# Patient Record
Sex: Male | Born: 1945 | Race: White | Hispanic: No | Marital: Married | Smoking: Never smoker
Health system: Southern US, Community
[De-identification: ages and names within clinical notes are randomized; demographics above are authoritative.]

## PROBLEM LIST (undated history)

## (undated) DIAGNOSIS — N400 Enlarged prostate without lower urinary tract symptoms: Secondary | ICD-10-CM

## (undated) DIAGNOSIS — G454 Transient global amnesia: Secondary | ICD-10-CM

## (undated) DIAGNOSIS — C61 Malignant neoplasm of prostate: Secondary | ICD-10-CM

## (undated) DIAGNOSIS — N289 Disorder of kidney and ureter, unspecified: Secondary | ICD-10-CM

## (undated) HISTORY — DX: Transient global amnesia: G45.4

## (undated) HISTORY — PX: FRACTURE SURGERY: SHX138

## (undated) HISTORY — PX: KIDNEY STONE SURGERY: SHX686

## (undated) HISTORY — PX: CHOLECYSTECTOMY: SHX55

---

## 2005-01-09 ENCOUNTER — Ambulatory Visit: Payer: Self-pay

## 2006-06-11 ENCOUNTER — Ambulatory Visit: Payer: Self-pay

## 2006-07-02 ENCOUNTER — Encounter: Admission: RE | Admit: 2006-07-02 | Discharge: 2006-07-02 | Payer: Self-pay | Admitting: Internal Medicine

## 2006-08-01 ENCOUNTER — Ambulatory Visit (HOSPITAL_COMMUNITY): Admission: RE | Admit: 2006-08-01 | Discharge: 2006-08-01 | Payer: Self-pay | Admitting: General Surgery

## 2011-08-27 ENCOUNTER — Encounter (INDEPENDENT_AMBULATORY_CARE_PROVIDER_SITE_OTHER): Admitting: Internal Medicine

## 2011-08-27 DIAGNOSIS — E538 Deficiency of other specified B group vitamins: Secondary | ICD-10-CM

## 2011-08-27 DIAGNOSIS — Z0000 Encounter for general adult medical examination without abnormal findings: Secondary | ICD-10-CM

## 2011-08-31 ENCOUNTER — Telehealth: Payer: Self-pay

## 2011-08-31 NOTE — Telephone Encounter (Signed)
.  [facility] PT STATES HE RECEIVED A CALL REGARDING HIS LABS PLEASE CALL [phone]

## 2011-09-01 NOTE — Telephone Encounter (Signed)
Left message on machine to call back  

## 2011-09-03 NOTE — Telephone Encounter (Signed)
Left message on machine to call back  

## 2011-09-04 NOTE — Telephone Encounter (Signed)
Sending back to Lab because do not have lab report or notes to call pt.

## 2011-09-18 ENCOUNTER — Telehealth: Payer: Self-pay

## 2011-09-18 DIAGNOSIS — R972 Elevated prostate specific antigen [PSA]: Secondary | ICD-10-CM

## 2011-09-18 NOTE — Telephone Encounter (Signed)
Pt is wanting to go ahead with a referral to [facility] Please send order to referrals workqueue  Please call [phone] or [phone]

## 2011-09-18 NOTE — Telephone Encounter (Signed)
Dr. Perrin Maltese. Chart in your box. Please refer to Urology for pt (see labs in paper chart)

## 2011-09-27 ENCOUNTER — Telehealth: Payer: Self-pay

## 2011-09-27 NOTE — Telephone Encounter (Signed)
Last office visit and labs faxed to Dawn's attention.

## 2011-09-27 NOTE — Telephone Encounter (Signed)
.  [facility] DAWN FROM DR PUSCHINSKY'S OFFICE STATES PT IS THERE NOW AND ARE IN NEED OF RECORDS PLEASE FAX TO [phone] AND THE PHONE NUMBER IS [phone]    PT WAITING

## 2012-07-13 ENCOUNTER — Encounter (HOSPITAL_COMMUNITY): Payer: Self-pay | Admitting: Nurse Practitioner

## 2012-07-13 ENCOUNTER — Emergency Department (HOSPITAL_COMMUNITY)
Admission: EM | Admit: 2012-07-13 | Discharge: 2012-07-13 | Disposition: A | Discharge status: Home / Self Care | Attending: Emergency Medicine | Admitting: Emergency Medicine

## 2012-07-13 ENCOUNTER — Emergency Department (HOSPITAL_COMMUNITY)

## 2012-07-13 ENCOUNTER — Other Ambulatory Visit: Payer: Self-pay

## 2012-07-13 ENCOUNTER — Emergency Department (INDEPENDENT_AMBULATORY_CARE_PROVIDER_SITE_OTHER)
Admission: EM | Admit: 2012-07-13 | Discharge: 2012-07-13 | Disposition: A | Discharge status: Skilled Nursing Facility | Source: Home / Self Care | Attending: Family Medicine | Admitting: Family Medicine

## 2012-07-13 ENCOUNTER — Encounter (HOSPITAL_COMMUNITY): Payer: Self-pay | Admitting: *Deleted

## 2012-07-13 DIAGNOSIS — R197 Diarrhea, unspecified: Secondary | ICD-10-CM

## 2012-07-13 DIAGNOSIS — R9431 Abnormal electrocardiogram [ECG] [EKG]: Secondary | ICD-10-CM | POA: Insufficient documentation

## 2012-07-13 DIAGNOSIS — Z7982 Long term (current) use of aspirin: Secondary | ICD-10-CM | POA: Insufficient documentation

## 2012-07-13 DIAGNOSIS — K5289 Other specified noninfective gastroenteritis and colitis: Secondary | ICD-10-CM | POA: Insufficient documentation

## 2012-07-13 DIAGNOSIS — E860 Dehydration: Secondary | ICD-10-CM

## 2012-07-13 DIAGNOSIS — K529 Noninfective gastroenteritis and colitis, unspecified: Secondary | ICD-10-CM

## 2012-07-13 DIAGNOSIS — R110 Nausea: Secondary | ICD-10-CM | POA: Insufficient documentation

## 2012-07-13 DIAGNOSIS — R5383 Other fatigue: Secondary | ICD-10-CM

## 2012-07-13 DIAGNOSIS — R531 Weakness: Secondary | ICD-10-CM

## 2012-07-13 DIAGNOSIS — IMO0001 Reserved for inherently not codable concepts without codable children: Secondary | ICD-10-CM | POA: Insufficient documentation

## 2012-07-13 DIAGNOSIS — Z79899 Other long term (current) drug therapy: Secondary | ICD-10-CM | POA: Insufficient documentation

## 2012-07-13 LAB — COMPREHENSIVE METABOLIC PANEL
BUN: 26 mg/dL — ABNORMAL HIGH (ref 6–23)
CO2: 29 mEq/L (ref 19–32)
Calcium: 9.2 mg/dL (ref 8.4–10.5)
Creatinine, Ser: 1 mg/dL (ref 0.50–1.35)
GFR calc Af Amer: 90 mL/min — ABNORMAL LOW (ref >90)
GFR calc non Af Amer: 78 mL/min — ABNORMAL LOW (ref >90)
Glucose, Bld: 106 mg/dL — ABNORMAL HIGH (ref 70–99)

## 2012-07-13 LAB — URINALYSIS, ROUTINE W REFLEX MICROSCOPIC
Leukocytes, UA: NEGATIVE
Nitrite: NEGATIVE
Specific Gravity, Urine: 1.035 — ABNORMAL HIGH (ref 1.005–1.030)
Urobilinogen, UA: 0.2 mg/dL (ref 0.0–1.0)

## 2012-07-13 LAB — CBC
MCV: 93.6 fL (ref 78.0–100.0)
Platelets: 110 10*3/uL — ABNORMAL LOW (ref 150–400)
RBC: 5.14 MIL/uL (ref 4.22–5.81)
RDW: 13.5 % (ref 11.5–15.5)
WBC: 4.6 10*3/uL (ref 4.0–10.5)

## 2012-07-13 LAB — POCT I-STAT, CHEM 8
Calcium, Ion: 1.12 mmol/L — ABNORMAL LOW (ref 1.13–1.30)
Chloride: 100 mEq/L (ref 96–112)
HCT: 50 % (ref 39.0–52.0)
Sodium: 140 mEq/L (ref 135–145)

## 2012-07-13 LAB — CBC WITH DIFFERENTIAL/PLATELET
Blasts: 0 %
Eosinophils Absolute: 0 10*3/uL (ref 0.0–0.7)
Eosinophils Relative: 0 % (ref 0–5)
HCT: 47.6 % (ref 39.0–52.0)
Lymphocytes Relative: 16 % (ref 12–46)
Lymphs Abs: 0.8 10*3/uL (ref 0.7–4.0)
Monocytes Absolute: 0.3 10*3/uL (ref 0.1–1.0)
Monocytes Relative: 6 % (ref 3–12)
Platelets: 118 10*3/uL — ABNORMAL LOW (ref 150–400)
RBC: 5.08 MIL/uL (ref 4.22–5.81)
RDW: 13.5 % (ref 11.5–15.5)
WBC: 5.1 10*3/uL (ref 4.0–10.5)
nRBC: 0 /100 WBC

## 2012-07-13 LAB — URINE MICROSCOPIC-ADD ON

## 2012-07-13 MED ORDER — KETOROLAC TROMETHAMINE 30 MG/ML IJ SOLN
15.0000 mg | Freq: Once | INTRAMUSCULAR | Status: DC
  Filled 2012-07-13: qty 1

## 2012-07-13 MED ORDER — SODIUM CHLORIDE 0.9 % IV SOLN
INTRAVENOUS | Status: DC
  Administered 2012-07-13: 17:00:00 via INTRAVENOUS

## 2012-07-13 MED ORDER — SODIUM CHLORIDE 0.9 % IV BOLUS (SEPSIS)
500.0000 mL | Freq: Once | INTRAVENOUS | Status: AC
  Administered 2012-07-13: 500 mL via INTRAVENOUS

## 2012-07-13 MED ORDER — SODIUM CHLORIDE 0.9 % IV BOLUS (SEPSIS)
1000.0000 mL | Freq: Once | INTRAVENOUS | Status: AC
  Administered 2012-07-13: 1000 mL via INTRAVENOUS

## 2012-07-13 NOTE — ED Provider Notes (Signed)
History     CSN: [id number]  Arrival date & time 07/13/12  1121   First MD Initiated Contact with Patient 07/13/12 1137      Chief Complaint  Patient presents with  . URI    (Consider location/radiation/quality/duration/timing/severity/associated sxs/prior treatment) HPI Comments: 66 year old nonsmoker and denies significant past medical history. Here complaining of general malaise, lack of appetite, nonproductive cough, liquid diarrhea about 3 times a day with no blood or mucus and generalized weakness for one week. Denies fever, congestion, vomiting, dizziness, chest pain or shortness of breath. Reports chest discomfort with talking or taking a deep breath in the last few days but actually reports, he is not feeling chest pain today. Has been drinking water for oral hydration. Not taking any other medications, denies abdominal pain or cramping.   History reviewed. No pertinent past medical history.  History reviewed. No pertinent past surgical history.  No family history on file.  History  Substance Use Topics  . Smoking status: Never Smoker   . Smokeless tobacco: Not on file  . Alcohol Use: No      Review of Systems  Constitutional: Positive for appetite change and fatigue. Negative for fever, chills and diaphoresis.  HENT: Negative for congestion and rhinorrhea.   Respiratory: Positive for cough. Negative for shortness of breath and wheezing.   Cardiovascular:       Chest dyscomfort  Gastrointestinal: Positive for diarrhea. Negative for vomiting and abdominal pain.  Skin: Negative for rash.  Neurological: Negative for dizziness and headaches.  All other systems reviewed and are negative.    Allergies  Review of patient's allergies indicates no known allergies.  Home Medications  No current outpatient prescriptions on file.  BP 110/77  Pulse 70  Temp 99 F (37.2 C) (Oral)  Resp 17  SpO2 97%  Physical Exam  Nursing note and vitals  reviewed. Constitutional: He is oriented to person, place, and time. He appears well-developed and well-nourished.       Pale, looks uncomfortable  HENT:  Head: Normocephalic and atraumatic.  Right Ear: External ear normal.  Left Ear: External ear normal.  Nose: Nose normal.  Mouth/Throat: No oropharyngeal exudate.       Dry mucus membranes  Eyes: Conjunctivae normal and EOM are normal. Pupils are equal, round, and reactive to light. No scleral icterus.  Neck: Neck supple. No JVD present. No thyromegaly present.  Cardiovascular: Normal rate.   Murmur heard.      Heart rate in 50-60s during exam. 3/6 SEM.  No LEE   Pulmonary/Chest: Effort normal and breath sounds normal. No respiratory distress. He has no wheezes. He has no rales. He exhibits no tenderness.  Abdominal: Soft. Bowel sounds are normal. He exhibits no distension and no mass. There is no tenderness. There is no rebound and no guarding.  Lymphadenopathy:    He has no cervical adenopathy.  Neurological: He is alert and oriented to person, place, and time.  Skin: No rash noted.    ED Course  Procedures (including critical care time)  Labs Reviewed  POCT I-STAT, CHEM 8 - Abnormal; Notable for the following:    BUN 25 (*)     Glucose, Bld 105 (*)     Calcium, Ion 1.12 (*)     All other components within normal limits  CBC   No results found.   1. Weakness generalized   2. Diarrhea   3. Abnormal EKG    EKG sinus bradycardia with a ventricular rate of 53  beats per minutes. Subtle ST changes in V1 and V2, flat T waves in most leads but T wave inversion in V3, V4, V5, V6.   MDM  66 year old male previously healthy with no significant past medical history. Here with wife complaining of generalized weakness, nonproductive cough, diarrhea, minimal oral intake for one week. Patient also experiencing chest discomfort in the last 2 days when taking a deep breath or talking. Chest pain is actually better today. On exam:  Patient looks pale. Dry mucous membranes. No JVD. Lungs are clear. 3/6 SEM. Normal abdominal exam. EKG with sinus bradycardia impress a slight ST changes in V1 and V2. T waves inverted or flattened in most leads but deeply inverted in V3, V4, V5. Vital signs are stable with a blood pressure in 110s/ 77, heart rate high 50s. Chest x-ray has been canceled after the transfer decision was made. It was decided to transfer patient to the emergency department for further evaluation and management.          Sharin Grave, MD 07/13/12 1337

## 2012-07-13 NOTE — ED Notes (Signed)
Pt went to [facility] for flu symptoms, n/v/d for past week. [facility] sent to ER for ekg changes there. Pt states he "just doesn't feel well." A&Ox4, resp e/u

## 2012-07-13 NOTE — ED Notes (Addendum)
Pt states he went to Urgent Care b/c he has had the flu x8 days. While at [facility], EKG read abnormal, sent to ED. Pt is A&Ox4, ambulatory, general body aches, "feel blah". Pt reports night sweats, intermittent dry cough, chills. Family at bedside. Not feeling very hungry since a week ago.

## 2012-07-13 NOTE — ED Provider Notes (Signed)
History     CSN: [id number]  Arrival date & time 07/13/12  1333   First MD Initiated Contact with Patient 07/13/12 1421      Chief Complaint  Patient presents with  . Abnormal ECG    (Consider location/radiation/quality/duration/timing/severity/associated sxs/prior treatment) The history is provided by the patient.   patient here complaining of watery diarrhea and myalgias x3 days. He denies any chest pain chest pressure. No anginal type symptoms. No fever or cough. Nausea without vomiting. Went to urgent care and had EKG which showed T-wave inversions and patient transported here for further evaluation. Notes he has been having increasing amounts of water without any other nutrition. No prior history of same. No recent traveling history. No recent medications.  History reviewed. No pertinent past medical history.  History reviewed. No pertinent past surgical history.  History reviewed. No pertinent family history.  History  Substance Use Topics  . Smoking status: Never Smoker   . Smokeless tobacco: Not on file  . Alcohol Use: No      Review of Systems  All other systems reviewed and are negative.    Allergies  Review of patient's allergies indicates no known allergies.  Home Medications   Current Outpatient Rx  Name  Route  Sig  Dispense  Refill  . ASPIRIN EC 81 MG PO TBEC   Oral   Take 81 mg by mouth daily.         Marland Kitchen VITAMIN B-12 PO   Oral   Take 1 tablet by mouth daily.           BP 118/76  Pulse 62  Temp 97.9 F (36.6 C) (Oral)  Resp 20  SpO2 98%  Physical Exam  Nursing note and vitals reviewed. Constitutional: He is oriented to person, place, and time. He appears well-developed and well-nourished.  Non-toxic appearance. No distress.  HENT:  Head: Normocephalic and atraumatic.  Eyes: Conjunctivae normal, EOM and lids are normal. Pupils are equal, round, and reactive to light.  Neck: Normal range of motion. Neck supple. No tracheal deviation  present. No mass present.  Cardiovascular: Normal rate, regular rhythm and normal heart sounds.  Exam reveals no gallop.   No murmur heard. Pulmonary/Chest: Effort normal and breath sounds normal. No stridor. No respiratory distress. He has no decreased breath sounds. He has no wheezes. He has no rhonchi. He has no rales.  Abdominal: Soft. Normal appearance and bowel sounds are normal. He exhibits no distension. There is no tenderness. There is no rebound and no CVA tenderness.  Musculoskeletal: Normal range of motion. He exhibits no edema and no tenderness.  Neurological: He is alert and oriented to person, place, and time. He has normal strength. No cranial nerve deficit or sensory deficit. GCS eye subscore is 4. GCS verbal subscore is 5. GCS motor subscore is 6.  Skin: Skin is warm and dry. No abrasion and no rash noted.  Psychiatric: He has a normal mood and affect. His speech is normal and behavior is normal.    ED Course  Procedures (including critical care time)   Labs Reviewed  CBC WITH DIFFERENTIAL  COMPREHENSIVE METABOLIC PANEL   Dg Chest 2 View  07/13/2012  *RADIOLOGY REPORT*  Clinical Data: Cough.  Weakness.  CHEST - 2 VIEW  Comparison:  08/01/2006  Findings:  The heart size and mediastinal contours are within normal limits.  Both lungs are clear.  The visualized skeletal structures are unremarkable.  IMPRESSION: No active cardiopulmonary disease.   Original  Report Authenticated By: Myles Rosenthal, M.D.      No diagnosis found.    MDM   Date: 05/10/2012  Rate: 58  Rhythm: normal sinus rhythm  QRS Axis: normal  Intervals: normal  ST/T Wave abnormalities: nonspecific T wave changes  Conduction Disutrbances:none  Narrative Interpretation:   Old EKG Reviewed: none available  Patient without any signs of angina at this time. No chest pain or chest pressure. No concern for ACS at this time. Suspect the patient has a viral illness. He was given IV fluids and does flow better.  He'll be discharged home.   5:57 PM\ Patient's urinalysis shows mild dehydration. He was offered Toradol but has deferred. He is requesting to be discharged at this time.     Toy Baker, MD 07/13/12 [id number]

## 2012-07-13 NOTE — ED Provider Notes (Signed)
MSE performed.   Patient from urgent care who presented with "flulike symptoms". Patient describes myalgias and watery, nonbloody diarrhea for the past 8 days. He does not usually see a doctor. He presented because his symptoms are not improving. Patient denies fever, upper respiratory tract infection symptoms, vomiting, chest pain, shortness of breath. Patient has had episodes of cough however this is not currently present. He states that his appetite is decreased. He denies abdominal pain or urinary symptoms. Patient states that he has lost 8-10 pounds over the past week but denies unexplained recent weight loss, unexplained recent fevers. Remote history of smoking. Onset acute. Course is persistent. Nothing makes symptoms better worse.  Of note, the patient was found to have an abnormal EKG at urgent care. Patient does not have a history of heart disease. No prior EKGs available for comparison.  Labs and fluid ordered.  Exam:  Gen NAD; ENT conjunctiva normal, no rhinorrhea, pharynx normal; Heart RRR, nml S1,S2, no m/r/g; Lungs CTAB; Abd soft, NT, no rebound or guarding; Ext 2+ pedal pulses bilaterally, no edema.   Date: 07/13/2012  Rate: 58  Rhythm: sinus bradycardia  QRS Axis: normal  Intervals: normal  ST/T Wave abnormalities: nonspecific T wave changes  Conduction Disutrbances:none  Narrative Interpretation: anterolateral t-wave inversion  Old EKG Reviewed: none available    Renne Crigler, [place] 07/13/12 1448

## 2012-07-13 NOTE — ED Notes (Signed)
Patient complains of body aches, diarrhea, weakness, constant cough that x 1 week. Diminished apatite. Denies nausea, vomiting, dizziness, chest and head congestion.

## 2012-07-13 NOTE — ED Provider Notes (Signed)
Medical screening examination/treatment/procedure(s) were performed by non-physician practitioner and as supervising physician I was immediately available for consultation/collaboration.  Kearra Calkin K Linker, MD 07/13/12 1458 

## 2012-07-14 LAB — URINE CULTURE

## 2012-08-04 ENCOUNTER — Other Ambulatory Visit: Payer: Self-pay | Admitting: *Deleted

## 2012-08-04 ENCOUNTER — Ambulatory Visit (INDEPENDENT_AMBULATORY_CARE_PROVIDER_SITE_OTHER): Admitting: Internal Medicine

## 2012-08-04 ENCOUNTER — Encounter: Payer: Self-pay | Admitting: Internal Medicine

## 2012-08-04 VITALS — BP 120/80 | HR 50 | Temp 97.9°F | Resp 16 | Ht 69.5 in | Wt 178.6 lb

## 2012-08-04 DIAGNOSIS — R011 Cardiac murmur, unspecified: Secondary | ICD-10-CM

## 2012-08-04 DIAGNOSIS — E538 Deficiency of other specified B group vitamins: Secondary | ICD-10-CM

## 2012-08-04 DIAGNOSIS — D696 Thrombocytopenia, unspecified: Secondary | ICD-10-CM

## 2012-08-04 DIAGNOSIS — C61 Malignant neoplasm of prostate: Secondary | ICD-10-CM

## 2012-08-04 DIAGNOSIS — N529 Male erectile dysfunction, unspecified: Secondary | ICD-10-CM

## 2012-08-04 DIAGNOSIS — Z0000 Encounter for general adult medical examination without abnormal findings: Secondary | ICD-10-CM

## 2012-08-04 LAB — TSH: TSH: 1.802 u[IU]/mL (ref 0.350–4.500)

## 2012-08-04 LAB — POCT CBC
Lymph, poc: 1.9 (ref 0.6–3.4)
MCH, POC: 31.5 pg — AB (ref 27–31.2)
MCHC: 32 g/dL (ref 31.8–35.4)
MID (cbc): 0.5 (ref 0–0.9)
MPV: 11.3 fL (ref 0–99.8)
POC Granulocyte: 4.7 (ref 2–6.9)
POC LYMPH PERCENT: 26.5 %L (ref 10–50)
POC MID %: 7.3 %M (ref 0–12)
Platelet Count, POC: 243 10*3/uL (ref 142–424)
RDW, POC: 14.1 %
WBC: 7.1 10*3/uL (ref 4.6–10.2)

## 2012-08-04 LAB — POCT URINALYSIS DIPSTICK
Bilirubin, UA: NEGATIVE
Glucose, UA: NEGATIVE
Nitrite, UA: NEGATIVE
Urobilinogen, UA: 0.2

## 2012-08-04 LAB — IFOBT (OCCULT BLOOD): IFOBT: POSITIVE

## 2012-08-04 LAB — PSA: PSA: 5.34 ng/mL — ABNORMAL HIGH (ref <=4.00)

## 2012-08-04 LAB — LIPID PANEL: Cholesterol: 166 mg/dL (ref 0–200)

## 2012-08-04 LAB — POCT UA - MICROSCOPIC ONLY
Bacteria, U Microscopic: NEGATIVE
Casts, Ur, LPF, POC: NEGATIVE
Mucus, UA: NEGATIVE
WBC, Ur, HPF, POC: NEGATIVE
Yeast, UA: NEGATIVE

## 2012-08-04 MED ORDER — SILDENAFIL CITRATE 100 MG PO TABS: 100.0000 mg | ORAL_TABLET | Freq: Every day | ORAL | Status: DC | PRN

## 2012-08-04 NOTE — Progress Notes (Signed)
Subjective:    Patient ID: Gary Roberts, male    DOB: 06/30/1946, 67 y.o.   MRN: [id number]  HPI Had er visit for dehydration and viral illness. Had thrombocytopenia 2 weeks ago. Recently dx with mild prostate cancer and being observed, no active tx. B12 deficiency in the past. Has abnormal ekg related to extreme conditioning. Exercising regularly.   Review of Systems  Constitutional: Negative.   HENT: Positive for neck pain.   Eyes: Negative.   Respiratory: Negative.   Cardiovascular: Negative.   Gastrointestinal: Negative.   Genitourinary: Negative.   Musculoskeletal: Positive for back pain and arthralgias.  Neurological: Negative.   Psychiatric/Behavioral: Negative.        Objective:   Physical Exam  Vitals reviewed. Constitutional: He is oriented to person, place, and time. He appears well-developed and well-nourished.  HENT:  Right Ear: External ear normal.  Left Ear: External ear normal.  Nose: Nose normal.  Mouth/Throat: Oropharynx is clear and moist.  Eyes: EOM are normal. Pupils are equal, round, and reactive to light.  Neck: Normal range of motion. Neck supple. No tracheal deviation present. No thyromegaly present.  Cardiovascular: Normal rate, regular rhythm and intact distal pulses.   Murmur heard. Pulmonary/Chest: Effort normal and breath sounds normal. No respiratory distress.  Abdominal: Soft. Bowel sounds are normal. He exhibits no mass. There is no tenderness.  Genitourinary: Rectum normal, prostate normal and penis normal.  Musculoskeletal: He exhibits tenderness.  Lymphadenopathy:    He has no cervical adenopathy.  Neurological: He is alert and oriented to person, place, and time. He has normal reflexes. No cranial nerve deficit. He exhibits normal muscle tone. Coordination normal.  Skin: No rash noted.  Psychiatric: He has a normal mood and affect. His behavior is normal. Judgment and thought content normal.   Results for orders placed in visit on  08/04/12  POCT UA - MICROSCOPIC ONLY      Component Value Range   WBC, Ur, HPF, POC neg     RBC, urine, microscopic neg     Bacteria, U Microscopic neg     Mucus, UA neg     Epithelial cells, urine per micros 0-1     Crystals, Ur, HPF, POC neg     Casts, Ur, LPF, POC neg     Yeast, UA neg    POCT URINALYSIS DIPSTICK      Component Value Range   Color, UA yellow     Clarity, UA clear     Glucose, UA neg     Bilirubin, UA neg     Ketones, UA neg     Spec Grav, UA 1.010     Blood, UA neg     pH, UA 6.0     Protein, UA neg     Urobilinogen, UA 0.2     Nitrite, UA neg     Leukocytes, UA Negative    POCT CBC      Component Value Range   WBC 7.1  4.6 - 10.2 K/uL   Lymph, poc 1.9  0.6 - 3.4   POC LYMPH PERCENT 26.5  10 - 50 %L   MID (cbc) 0.5  0 - 0.9   POC MID % 7.3  0 - 12 %M   POC Granulocyte 4.7  2 - 6.9   Granulocyte percent 66.2  37 - 80 %G   RBC 4.96  4.69 - 6.13 M/uL   Hemoglobin 15.6  14.1 - 18.1 g/dL   HCT, POC 48.8  43.5 - 53.7 %   MCV 98.4 (*) 80 - 97 fL   MCH, POC 31.5 (*) 27 - 31.2 pg   MCHC 32.0  31.8 - 35.4 g/dL   RDW, POC 78.2     Platelet Count, POC 243  142 - 424 K/uL   MPV 11.3  0 - 99.8 fL          Assessment & Plan:  Thrombocytopenia resolved B12 def probably controlled withoral b12 Prostate cancer being closely followed

## 2012-08-04 NOTE — Patient Instructions (Addendum)
Prostate Cancer The prostate is a male gland that is involved in the production of semen. It is located between the bladder and the rectum. The normal prostate gland is the size of a walnut and surrounds the tube that carries urine from the bladder (urethra). Prostate cancer is the abnormal growth of cells in the gland. Next to skin cancer, prostatic cancer is the most common cancer in men. One out of 6 men will be diagnosed with this in their lifetime. Although the number with this disease is large, the majority of men diagnosed with prostate cancer do not die from it.  CAUSES  Age is the biggest risk factor for this disease. Growing older increases the chances of developing prostate cancer. The second most common risk factor is genetics. That means if a man has a father or brother with prostate cancer then he has twice the risk of developing prostate cancerVitamin B12 Deficiency Not having enough vitamin B12 is called a deficiency. Vitamin B12 is an important vitamin. Your body needs vitamin B12 to:   Make red blood cells.  Make DNA. This is the genetic material inside all of your cells.  Help your nerves work properly so they can carry messages from your brain to your body. CAUSES  Not eating enough foods that contain vitamin B12.  Not having enough stomach acid and digestive juices. The body needs these to absorb vitamin B12 from the food you eat.  Having certain digestive system diseases that make it hard to absorb vitamin B12. These diseases include Crohn's disease, chronic pancreatitis, and cystic fibrosis.  Having pernicious anemia, which is a condition where the body has too few red blood cells. People with this condition do not make enough of a protein called "intrinsic factor," which is needed to absorb vitamin B12.  Having a surgery in which part of the stomach or small intestine is removed.  Taking certain medicines that make it hard for the body to absorb vitamin B12. These  medicines include:  Heartburn medicine (antacids and proton pump inhibitors).  A certain antibiotic medicine called neomycin, which fights infection.  Some medicines used to treat diabetes, tuberculosis, gout, and high cholesterol. RISK FACTORS Risk factors are things that make you more likely to develop a vitamin B12 deficiency. They include:  Being older than 50.  Being a vegetarian.  Being pregnant and a vegetarian or having a poor diet.  Taking certain drugs.  Being an alcoholic. SYMPTOMS You may have a vitamin B12 deficiency with no symptoms. However, a vitamin B12 deficiency can cause health problems like anemia and nerve damage. These health problems can lead to many possible symptoms, including:  Weakness.  Fatigue.  Loss of appetite.  Weight loss.  Numbness or tingling in your hands and feet.  Redness and burning of the tongue.  Confusion or memory problems.  Depression.  Dizziness.  Sensory problems, such as loss of taste, color blindness, and ringing in the ears.  Diarrhea or constipation.  Trouble walking. DIAGNOSIS Various types of tests can be given to help find the cause of your vitamin B12 deficiency. These tests include:  A complete blood count (CBC). This test gives your caregiver an overall picture of what makes up your blood.  A blood test to measure your B12 level.  A blood test to measure intrinsic factor.  An endoscopy. This procedure uses a thin tube with a camera on the end to look into your stomach or intestines. TREATMENT Treatment for vitamin B12 deficiency depends  on what is causing it. Common options include:  Changing your eating and drinking habits, such as:  Eating more foods that contain vitamin B12.  Not drinking as much alcohol or any alcohol.  Taking vitamin B12 supplements. Your caregiver will tell you what dose is best for you.  Getting vitamin B12 injections. Some people get these a few times a week. Others get  them once a month. HOME CARE INSTRUCTIONS  Take all supplements as directed by your caregiver. Follow the directions carefully.  Get any injections your caregiver prescribes. Do not miss your appointments.  Eat lots of healthy foods that contain vitamin B12. Ask your caregiver if you should work with a nutritionist. Good things to include in your diet are:  Meat.  Poultry.  Fish.  Eggs.  Fortified cereal and dairy products. This means vitamin B12 has been added to the food. Check the label on the package to be sure.  Do not abuse alcohol.  Keep all follow-up appointments. Your caregiver will need to perform blood tests to make sure your vitamin B12 deficiency is going away. SEEK MEDICAL CARE IF:  You have any questions about your treatment.  Your symptoms come back. MAKE SURE YOU:  Understand these instructions.  Will watch your condition.  Will get help right away if you are not doing well or get worse. Document Released: 10/08/2011 Document Reviewed: 10/08/2011 [facility] Patient Information 2013 [place], [place]. . Estimations are that 5% to 10% of all prostate cancer cases are hereditary.  SYMPTOMS  Most older men suffer from an enlarged prostate gland (benign prostatic hypertrophy, BPH). Because prostate cancer occurs in this population, the symptoms that a man has with prostate cancer are almost always due to the enlarged prostate gland and not directly caused by the cancer. With the exception of pain in the back, hips, or thighs, the symptoms listed below are usually caused by the enlarged prostate gland and do not indicate prostate cancer. Prostate cancer is usually found because the caregiver is evaluating symptoms or because a blood test indicates prostate cancer without the symptoms.  Frequent urination.  Difficulty starting or stopping urination.  Inability to urinate.  Weak or interrupted flow of urine.  Painful or burning urination.  Painful  ejaculation.  Blood in urine or semen.  Persistent pain in the lower back, hips, or upper thighs. Symptoms actually caused by the cancer only occur in extremely advanced cases. Therefore, regular exams are especially important. DIAGNOSIS  Screening Consult with your caregiver about prostate cancer screening. Several types of screening tests are available:   Prostate Specific Antigen (PSA) blood test. Healthy men should no longer receive PSA blood tests as part of routine cancer screening.  Digital rectal examination (DRE). Your caregiver advances a gloved, lubricated finger into the rectum and feels the part of the prostate that lies next to it. A DRE is not recommended as a routine screening test for cancer of the prostate.  Transrectal ultrasound. This test uses low volume, high frequency sound waves to produce an electrical "picture" of internal organs on a screen. For prostate screening, the device used to produce the sound waves must be placed inside the rectum so it is close to the prostate gland. This test is not usually painful. Testing If there is concern about prostate cancer, your caregiver may ask for tests in addition to those listed above. These could include:  X-rays.  CT scan.  MRI.  Blood tests other than PSA.  Urine tests. The only way  to make a final diagnosis of prostatic cancer is to get a tissue sample (biopsy). A biopsy is done by putting a needle into the prostate. It is guided into position by an ultrasound probe that has been placed into the rectum. Usually 8 to 12 samples are taken. This test is not usually done with any anesthesia. This test is not particularly painful. Most people who undergo a biopsy find that the first few biopsies are hardly felt at all. As the number of biopsy specimens are increased, a slight burning sensation is described. The specimens are then sent to a specialist who looks at tissues and cells (a pathologist). The pathologist will look  at the sample under a microscope to determine if the tissue is cancerous. If the tissue is cancerous, the cancer is also a Gleason score by the pathologist. This is a system of grading prostate cancer tissue based on how it looks under a microscope. The score indicates how likely it is that a tumor will spread. The pathologist looks for two patterns and gives a number to each pattern. The first pattern represents the majority of the tumor. The second pattern represents the pattern of the smaller amount of the tumor. When you look at the report, you will see a number such as Gleason 3 + 4. These two patterns are added together to give a total score. In this example, the total Gleason score is 7. This means that the tumor is mostly low-grade with some high-grade tumor. A low total Gleason score means the cancer tissue looks more like normal prostate tissue and the tumor is less likely to spread. A high Gleason score means the cancer tissue is very different from normal. This high score means the tumor is more likely to spread. STAGING If a diagnosis of cancer has been established by the biopsy, the next step is to stage the cancer. This means it is put in a category based on how far the cancer has spread. This is important for helping your caregivers plan appropriate treatment. Accurate staging may require one or more of the following tests: Pre-surgery staging tests (tests done with imaging machines)  Radionuclide bone scan. A test to detect any cancer cells in bone.  MRI.  CT scan.  Nuclear Scan. A test to see if the cancer has spread to tissues other than bone. Tests that require a procedure:  Pelvic lymphadenectomy. This is a lymph node biopsy. This biopsy is performed during an open surgical procedure. Most of the time it is done as a routine part of the surgery to remove the prostate.  Laparoscopic Biopsy. Long, thin instruments with a camera are inserted into the abdomen and some of the lymph  nodes are removed.  Fine needle aspiration. A long needle is inserted through the skin and a small sample of a lymph node is taken for pathology examination. The following are the different stages of prostate cancer: Stage I. Cancer is found in the prostate only. It cannot be felt during a DRE and is not visible by imaging. It is usually found accidentally, such as during surgery for other prostate problems. The Gleason score is low.  Stage II. Cancer is more advanced than in stage I but has not spread outside the prostate.  Stage III. Cancer has spread beyond the outer layer of the prostate to nearby tissues. Cancer may be found in the seminal vesicles.  Stage IV. Cancer has spread (metastasized) to lymph nodes or to other parts of the  body. The cancer may have spread to the bladder, rectum, bones, liver, or lungs. Metastatic prostate cancer often spreads to the bones.  TREATMENT  Treatments such as surgery, medications, and radiation may be recommended based on the stage of the cancer and Gleason score. If a cure is not likely, your caregiver may prescribe hormonal treatment or external beam radiation. These can slow the growth of cancer. Your caregiver will help you with these decisions.  Once cancer of the prostate has been diagnosed, your caregiver will discuss your treatment with you. Your caregiver will help you decide the best course of treatment. Treatment often depends on your age, health, and other risk factors. The more common methods of treatment are:  Watchful waiting or active surveillance. In this type of treatment, a decision for treatments such as radiation or surgical treatment is temporarily or perhaps permanently deferred. This decision depends upon the prostate cancer progression and other factors such as: age, personal preferences, and other disease problems. The disease may be followed with PSA tests, DRE, biopsies, or imaging examinations. The risk is primarily that the optimal  time for treating the cancer may pass.  Open surgical. This involves an operation to remove the prostate. It is accomplished through several different incisions. The most common approach is a horizontal incision just above the pubic hairline. Another less commonly used approach is through the skin between the anus and scrotum. Your caregiver will discuss which procedure may be best for you. Possible common side effects of surgical treatment are:  Impotence.  Problems with control of urination.  Laparoscopic prostatectomy. Four to 5 small incisions ( inch each) are made in the skin of the abdomen and small instruments and cameras are inserted to remove the prostate and lymph nodes.  Robotic Prostatectomy. The prostate and lymph nodes are removed by surgical instruments that are inserted through tiny incisions in the abdominal wall and connected to a 3-dimensional computer to allow the surgeon to manipulate the robotic arms. Magnification allows the surgeon to better perform nerve sparing procedures. This decreases the chance of urine leakage problems after the surgery.  Orchiectomy. This is the removal of the testicles which is done to reduce the male hormone testosterone. This hormone is known to stimulate the growth of prostate cancer. This treatment is used less often now. Occasionally silicone prostheses are placed in the scrotum with this treatment.  External Beam Radiation. This form of radiation aims beams of radiation from outside the body at the prostate. It is often as successful as surgery. It has the advantage of being more gentle than surgery. It is more useful for elderly and those in frail health. The disadvantages are that it is not as quick as surgery. It also causes some damage to surrounding tissue. For example, it can cause inflammation of the bladder and rectal areas. Possible side effects are bowel problems, such as diarrhea, stool leakage, or blood in the stool. These problems  usually go away in time. Bladder problems may include urgency, frequency and occasional leakage of urine. Impotence is also possible. Occasionally, fluid buildup in the legs or in the genitals occurs.  Internal Radiation (brachytherapy). Tiny radioactive needles, 40 to 100 pellets (seeds), wires, or catheters are implanted directly into the prostate gland. Placement is achieved with the use of ultrasound. Brachytherapy usually requires anesthesia and a combined effort by a radiation oncologist and a urologist. This treatment may not be possible in men with large prostate glands or a prostate gland that has been  trimmed down by previous surgery. Brachytherapy can deliver a higher dose of radiation in a localized area. This treatment causes little damage to surrounding tissues. Usual complications include urgency and frequency of urination. These complications generally subside after about 2 weeks. Bowel problems, urinary problems, and impotence may occur but are less likely to develop than through the external beam radiation treatment.  High-intensity focused ultrasound. This treatment uses a type of high-energy sound wave (ultrasound) to destroy cancer cells. To treat prostate cancer, a probe is placed inside the rectum and near the prostate. This probe is used to make the sound waves.  Cryosurgery. This is a treatment that uses an instrument to freeze and destroy prostate cancer cells. This procedure is not widely used because of the risk of damage to the rectum and urethra. In addition, there is a significant risk of impotence that is higher than many other treatments.  Chemotherapy. Medications are used to stop the growth of cancer cells either by killing them or by stopping them from multiplying. Chemotherapy may be used in advanced cancer of the prostate if the disease has spread to other parts of the body. Side effects depend on which medication is used. Side effects may  include:  Nausea.  Vomiting.  Hair loss.  Sores in the mouth.  Hormone Therapy. Hormones are substances produced by glands in the body and circulated in the blood. In prostate cancer, male sex hormones can cause prostate cancer to grow. This treatment removes hormones or blocks their action and can stop cancer cells from growing. Side effects may include:  Loss of sex drive.  Weakened bones.  Weight gain.  Heart disease.  Hot flashes.  Loss of muscle mass.  Erectile dysfunction.  Fatigue.  Loss of calcium in the bones (osteoporosis). HOME CARE INSTRUCTIONS  Care at home will depend upon your specific symptoms and the course of treatment agreed upon between you and your caregiver. There are many ways that the prostate cancer can affect someone. For these reasons, home care is customized for each individual. There are many helpful resources available.  Document Released: 07/16/2005 Document Revised: 10/08/2011 Document Reviewed: 11/26/2008 [facility] Patient Information 2013 [place], [place]. Erectile Dysfunction Erectile dysfunction (ED) is the inability to get a good enough erection to have sexual intercourse. ED may involve:  Inability to get an erection.  Lack of enough hardness to allow penetration.  Loss of the erection before sex is finished.  Premature ejaculation.  Any combination of these problems if they occur more than 25% of the time. CAUSES  Certain drugs, such as:  Pain relievers.  Antihistamines.  Antidepressants.  Blood pressure medicines.  Water pills.  Ulcer medicines.  Muscle relaxants.  Illegal drugs.  Excessive drinking.  Psychological causes, such as:  Anxiety.  Depression.  Sadness.  Exhaustion.  Performance fear.  Stress.  Physical causes, such as:  Artery problems. This may include diabetes, smoking, liver disease, or atherosclerosis.  High blood pressure.  Hormonal problems, such as low  testosterone.  Obesity.  Nerve problems. This may include back or pelvic injuries, diabetes, multiple sclerosis, Parkinson's disease, or some surgeries. SYMPTOMS  Inability to get an erection.  Lack of enough hardness to allow penetration.  Loss of the erection before sex is finished.  Premature ejaculation.  Normal erections at some times, but with frequent unsatisfactory episodes.  Orgasms that are not satisfactory in sensation or frequency.  Low sexual satisfaction in either partner because of erection problems.  A curved penis occurring with erection.  The curve may cause pain or may be too curved to allow for intercourse.  Never having nighttime erections. DIAGNOSIS Your caregiver can often diagnose this condition by:  Performing a physical exam to find other diseases or specific problems with the penis.  Asking you detailed questions about the problem.  Performing blood tests to check for diabetes or to measure hormone levels.  Performing urine tests to find other underlying health conditions.  Performing an ultrasound to check for scarring.  Performing a test to check blood flow to the penis.  Doing a sleep study at home to measure nighttime erections. TREATMENT   You may be prescribed medicines by mouth.  You may be given medicine injections into the penis.  You may be prescribed a vacuum pump with a ring.  Penile implant surgery may be performed. You may receive:  An inflatable implant.  A semi-rigid implant.  Blood vessel surgery may be performed. HOME CARE INSTRUCTIONS  Take all medicine as directed by your caregiver. Do not take any other medicines without talking to your caregiver first.  Follow your caregiver's directions for specific treatments as prescribed.  Follow up with your caregiver as directed. Document Released: 07/13/2000 Document Revised: 10/08/2011 Document Reviewed: 11/05/2010 [facility] Patient Information 2013 [place],  [place].

## 2013-01-01 ENCOUNTER — Encounter (HOSPITAL_COMMUNITY): Payer: Self-pay | Admitting: *Deleted

## 2013-01-01 ENCOUNTER — Inpatient Hospital Stay (HOSPITAL_COMMUNITY)
Admission: EM | Admit: 2013-01-01 | Discharge: 2013-01-03 | DRG: 690 | Disposition: A | Discharge status: Home / Self Care | Payer: Medicare Other | Attending: Internal Medicine | Admitting: Internal Medicine

## 2013-01-01 DIAGNOSIS — N390 Urinary tract infection, site not specified: Secondary | ICD-10-CM

## 2013-01-01 DIAGNOSIS — R319 Hematuria, unspecified: Secondary | ICD-10-CM

## 2013-01-01 DIAGNOSIS — N401 Enlarged prostate with lower urinary tract symptoms: Secondary | ICD-10-CM | POA: Diagnosis present

## 2013-01-01 DIAGNOSIS — N179 Acute kidney failure, unspecified: Secondary | ICD-10-CM

## 2013-01-01 DIAGNOSIS — N1330 Unspecified hydronephrosis: Secondary | ICD-10-CM | POA: Diagnosis present

## 2013-01-01 DIAGNOSIS — N400 Enlarged prostate without lower urinary tract symptoms: Secondary | ICD-10-CM

## 2013-01-01 DIAGNOSIS — Z87442 Personal history of urinary calculi: Secondary | ICD-10-CM

## 2013-01-01 DIAGNOSIS — D649 Anemia, unspecified: Secondary | ICD-10-CM | POA: Diagnosis present

## 2013-01-01 DIAGNOSIS — R339 Retention of urine, unspecified: Secondary | ICD-10-CM

## 2013-01-01 DIAGNOSIS — D72829 Elevated white blood cell count, unspecified: Secondary | ICD-10-CM

## 2013-01-01 DIAGNOSIS — N10 Acute tubulo-interstitial nephritis: Principal | ICD-10-CM

## 2013-01-01 DIAGNOSIS — N138 Other obstructive and reflux uropathy: Secondary | ICD-10-CM | POA: Diagnosis present

## 2013-01-01 DIAGNOSIS — Z8546 Personal history of malignant neoplasm of prostate: Secondary | ICD-10-CM

## 2013-01-01 HISTORY — DX: Benign prostatic hyperplasia without lower urinary tract symptoms: N40.0

## 2013-01-01 HISTORY — DX: Malignant neoplasm of prostate: C61

## 2013-01-01 HISTORY — DX: Disorder of kidney and ureter, unspecified: N28.9

## 2013-01-01 LAB — URINALYSIS, ROUTINE W REFLEX MICROSCOPIC
Glucose, UA: NEGATIVE mg/dL
pH: 6 (ref 5.0–8.0)

## 2013-01-01 LAB — COMPREHENSIVE METABOLIC PANEL
AST: 17 U/L (ref 0–37)
Albumin: 3.8 g/dL (ref 3.5–5.2)
Alkaline Phosphatase: 79 U/L (ref 39–117)
BUN: 30 mg/dL — ABNORMAL HIGH (ref 6–23)
Creatinine, Ser: 2.52 mg/dL — ABNORMAL HIGH (ref 0.50–1.35)
Potassium: 4.2 mEq/L (ref 3.5–5.1)
Total Protein: 6.8 g/dL (ref 6.0–8.3)

## 2013-01-01 LAB — CBC WITH DIFFERENTIAL/PLATELET
Basophils Absolute: 0 10*3/uL (ref 0.0–0.1)
Basophils Relative: 0 % (ref 0–1)
Eosinophils Absolute: 0 10*3/uL (ref 0.0–0.7)
Hemoglobin: 14.5 g/dL (ref 13.0–17.0)
MCH: 32.6 pg (ref 26.0–34.0)
MCHC: 35.5 g/dL (ref 30.0–36.0)
Monocytes Relative: 6 % (ref 3–12)
Neutrophils Relative %: 91 % — ABNORMAL HIGH (ref 43–77)
Platelets: 249 10*3/uL (ref 150–400)
RDW: 13.2 % (ref 11.5–15.5)

## 2013-01-01 LAB — URINE MICROSCOPIC-ADD ON

## 2013-01-01 MED ORDER — SODIUM CHLORIDE 0.9 % IV BOLUS (SEPSIS)
1000.0000 mL | Freq: Once | INTRAVENOUS | Status: AC
  Administered 2013-01-01: 1000 mL via INTRAVENOUS

## 2013-01-01 MED ORDER — SODIUM CHLORIDE 0.9 % IV SOLN: 1.0000 g | INTRAVENOUS | Status: DC

## 2013-01-01 MED ORDER — SODIUM CHLORIDE 0.9 % IV SOLN
INTRAVENOUS | Status: DC
  Administered 2013-01-01 – 2013-01-02 (×3): via INTRAVENOUS

## 2013-01-01 MED ORDER — SODIUM CHLORIDE 0.9 % IV SOLN
1.0000 g | Freq: Once | INTRAVENOUS | Status: DC
  Administered 2013-01-01: 1 g via INTRAVENOUS
  Filled 2013-01-01: qty 1

## 2013-01-01 NOTE — H&P (Addendum)
[facility] History and Physical  Gary Roberts QMV:[id number] DOB: 06-02-1946 DOA: 01/01/2013  Referring physician: ER physician PCP: No PCP Per Patient   Chief Complaint: fever, urinary retention  HPI:  67 year old male with past medical history of prostate cancer under observation, BPH, recent kidney stone removal (12/31/2012 at [facility]) and bilateral hydronephrosis with stent placement who comes is with complaints of no urine output for 1 day since his stone extraction. Patient reported he was taking cipro but even with that he still had subjective fever at home and could not urinate. He also had hematuria. No other GU complaints. No chest pain, no shortness of breath, no palpitations. No diarrhea, no constipation.  No blood in stool.  In ED, BP 138/76, HR 75 and Tmax 100.34F. CBC revealed leukocytosis of 22.3. BMP revealed acute creatinine elevation of 2.52. ED physician spoke with patient's primary urologist (Dr. Merry Lofty)  who asked for the admission and to start patient on Invanz and obtain blood cultures.   Assessment and Plan:  Principal Problem:   Urinary retention in the setting of recent bilateral hydronephrosis and stent placement - now has urine output - continue IV fluids - continue Invanz for possible UTI - as mentioned above if patietn does not improve then consider transfer to his urologist for further management. Active Problems:   Acute renal failure - likely due to bilateral hydronephrosis and UTI - continue to monitor renal function - continue IV fluids   Leukocytosis - secondary to UTI - started invanz - follow up CBC in am   UTI (lower urinary tract infection) - follow up results of urine culture - started invanz in ED  Code Status: Full Family Communication: Pt at bedside Disposition Plan: Admit for further evaluation  Manson Passey, MD  [facility] Pager [phone]  If 7PM-7AM, please contact night-coverage [email or web address] Password  [email or web address] 01/01/2013, 11:24 PM  Review of Systems:  Constitutional: positive for fever, chills and malaise/fatigue. Negative for diaphoresis.  HENT: Negative for hearing loss, ear pain, nosebleeds, congestion, sore throat, neck pain, tinnitus and ear discharge.   Eyes: Negative for blurred vision, double vision, photophobia, pain, discharge and redness.  Respiratory: Negative for cough, hemoptysis, sputum production, shortness of breath, wheezing and stridor.   Cardiovascular: Negative for chest pain, palpitations, orthopnea, claudication and leg swelling.  Gastrointestinal: Negative for nausea, vomiting and abdominal pain. Negative for heartburn, constipation, blood in stool and melena.  Genitourinary: Negative for dysuria, urgency, frequency, positive for hematuria and right flank pain.  Musculoskeletal: Negative for myalgias, back pain, joint pain and falls.  Skin: Negative for itching and rash.  Neurological: Negative for dizziness and weakness. Negative for tingling, tremors, sensory change, speech change, focal weakness, loss of consciousness and headaches.  Endo/Heme/Allergies: Negative for environmental allergies and polydipsia. Does not bruise/bleed easily.  Psychiatric/Behavioral: Negative for suicidal ideas. The patient is not nervous/anxious.      Past Medical History  Diagnosis Date  . Transient global amnesia   . Renal disorder     kidney stone   Past Surgical History  Procedure Laterality Date  . Cholecystectomy    . Kidney stone surgery     Social History:  reports that he has never smoked. He does not have any smokeless tobacco history on file. He reports that he does not drink alcohol or use illicit drugs.  No Known Allergies  Family History: Family medical history significant for HTN, HLD   Prior to Admission medications   Medication Sig Start  Date End Date Taking? Authorizing Provider  ciprofloxacin (CIPRO) 500 MG tablet Take 500 mg by mouth 2 (two) times daily.    Yes Historical Provider, MD  HYDROcodone-acetaminophen (NORCO/VICODIN) 5-325 MG per tablet Take 1 tablet by mouth every 6 (six) hours as needed for pain.   Yes Historical Provider, MD  aspirin EC 81 MG tablet Take 81 mg by mouth daily.    Historical Provider, MD   Physical Exam: Filed Vitals:   01/01/13 1923  BP: 138/76  Pulse: 75  Temp: 100.2 F (37.9 C)  TempSrc: Oral  Resp: 16  Height: 5\' 10"  (1.778 m)  Weight: 80.74 kg (178 lb)  SpO2: 96%    Physical Exam  Constitutional: Appears well-developed and well-nourished. No distress.  HENT: Normocephalic. External right and left ear normal. Oropharynx is clear and moist.  Eyes: Conjunctivae and EOM are normal. PERRLA, no scleral icterus.  Neck: Normal ROM. Neck supple. No JVD. No tracheal deviation. No thyromegaly.  CVS: RRR, S1/S2 +, no murmurs, no gallops, no carotid bruit.  Pulmonary: Effort and breath sounds normal, no stridor, rhonchi, wheezes, rales.  Abdominal: Soft. BS +,  no distension, tenderness in right flank pain, no rebound or guarding.  Musculoskeletal: Normal range of motion. No edema and no tenderness.  Lymphadenopathy: No lymphadenopathy noted, cervical, inguinal. Neuro: Alert. Normal reflexes, muscle tone coordination. No cranial nerve deficit. Skin: Skin is warm and dry. No rash noted. Not diaphoretic. No erythema. No pallor.  Psychiatric: Normal mood and affect. Behavior, judgment, thought content normal.   Labs on Admission:  Basic Metabolic Panel:  Recent Labs Lab 01/01/13 1924  NA 130*  K 4.2  CL 94*  CO2 23  GLUCOSE 124*  BUN 30*  CREATININE 2.52*  CALCIUM 9.0   Liver Function Tests:  Recent Labs Lab 01/01/13 1924  AST 17  ALT 17  ALKPHOS 79  BILITOT 1.0  PROT 6.8  ALBUMIN 3.8   No results found for this basename: LIPASE, AMYLASE,  in the last 168 hours No results found for this basename: AMMONIA,  in the last 168 hours CBC:  Recent Labs Lab 01/01/13 1924  WBC 22.3*  NEUTROABS  20.4*  HGB 14.5  HCT 40.8  MCV 91.7  PLT 249   Cardiac Enzymes: No results found for this basename: CKTOTAL, CKMB, CKMBINDEX, TROPONINI,  in the last 168 hours BNP: No components found with this basename: POCBNP,  CBG: No results found for this basename: GLUCAP,  in the last 168 hours  Radiological Exams on Admission: No results found.  EKG: Normal sinus rhythm, no ST/T wave changes  Time spent: 75 minutes

## 2013-01-01 NOTE — ED Notes (Signed)
Pt states that yesterday he was having kindey stones removed. Pt states that he has not urinated since yesterday and he has been drinking plenty of water. Pt also has had 2 dose of cipro and 1 dose of vicoden. Pt states that he has an enlarged prostate as well.

## 2013-01-01 NOTE — ED Provider Notes (Signed)
History     CSN: [id number]  Arrival date & time 01/01/13  1903   First MD Initiated Contact with Patient 01/01/13 2221      Chief Complaint  Patient presents with  . Urinary Retention    (Consider location/radiation/quality/duration/timing/severity/associated sxs/prior treatment) HPI Gary Roberts is a 67 y.o. male with a history of kidney stones presents emergency department with chief complaint of urinary retention.  Onset of symptoms began about 25 hours ago status post bilateral stent placement and difficult stone manipulation that was performed in [facility] by Dr.Pochinsky.  The patient reports associated abdominal distention, low-grade fevers onset this morning (highest 100.3), and right-sided flank pain.  Patient denies any night sweats, chills, hematemesis, nausea, vomiting, anorexia.  Patient states that he called his urologist after his office was closed at which point he advised him to go to the emergency department for urinary retention.  Past Medical History  Diagnosis Date  . Transient global amnesia   . Renal disorder     kidney stone    Past Surgical History  Procedure Laterality Date  . Cholecystectomy    . Kidney stone surgery      History reviewed. No pertinent family history.  History  Substance Use Topics  . Smoking status: Never Smoker   . Smokeless tobacco: Not on file  . Alcohol Use: No      Review of Systems Ten systems reviewed and are negative for acute change, except as noted in the HPI.    Allergies  Review of patient's allergies indicates no known allergies.  Home Medications   Current Outpatient Rx  Name  Route  Sig  Dispense  Refill  . ciprofloxacin (CIPRO) 500 MG tablet   Oral   Take 500 mg by mouth 2 (two) times daily.         Marland Kitchen HYDROcodone-acetaminophen (NORCO/VICODIN) 5-325 MG per tablet   Oral   Take 1 tablet by mouth every 6 (six) hours as needed for pain.         Marland Kitchen aspirin EC 81 MG tablet   Oral   Take 81 mg  by mouth daily.           BP 121/69  Pulse 65  Temp(Src) 100.2 F (37.9 C) (Oral)  Resp 16  Ht 5\' 10"  (1.778 m)  Wt 178 lb (80.74 kg)  BMI 25.54 kg/m2  SpO2 97%  Physical Exam  Nursing note and vitals reviewed. Constitutional: He is oriented to person, place, and time. He appears well-developed and well-nourished.  Nontoxic nonseptic appearing  HENT:  Head: Normocephalic and atraumatic.  Eyes: Conjunctivae and EOM are normal.  Neck: Normal range of motion.  Cardiovascular:  Regular rate rhythm, intact distal pulses  Pulmonary/Chest: Effort normal.  Lungs clear to auscultation bilaterally  Abdominal:  Inguinal tenderness extending into the suprapubic region.  Genitourinary:  Right-sided flank pain.  Fully catheter in place and actively draining gross hematuria  Musculoskeletal: Normal range of motion.  Neurological: He is alert and oriented to person, place, and time.  Skin: Skin is warm and dry.  No rash  Psychiatric: He has a normal mood and affect. His behavior is normal.    ED Course  Procedures (including critical care time)  Labs Reviewed  CBC WITH DIFFERENTIAL - Abnormal; Notable for the following:    WBC 22.3 (*)    Neutrophils Relative % 91 (*)    Neutro Abs 20.4 (*)    Lymphocytes Relative 3 (*)    Lymphs  Abs 0.6 (*)    Monocytes Absolute 1.3 (*)    All other components within normal limits  COMPREHENSIVE METABOLIC PANEL - Abnormal; Notable for the following:    Sodium 130 (*)    Chloride 94 (*)    Glucose, Bld 124 (*)    BUN 30 (*)    Creatinine, Ser 2.52 (*)    GFR calc non Af Amer 25 (*)    GFR calc Af Amer 29 (*)    All other components within normal limits  URINALYSIS, ROUTINE W REFLEX MICROSCOPIC - Abnormal; Notable for the following:    Color, Urine RED (*)    APPearance CLOUDY (*)    Hgb urine dipstick LARGE (*)    Ketones, ur 15 (*)    Protein, ur >300 (*)    Leukocytes, UA MODERATE (*)    All other components within normal limits   CULTURE, BLOOD (ROUTINE X 2)  CULTURE, BLOOD (ROUTINE X 2)  URINE CULTURE  URINE MICROSCOPIC-ADD ON   No results found.   1. Bilateral hydronephrosis   2. Acute renal failure   3. Leukocytosis   4. UTI (lower urinary tract infection)     Meds ordered this encounter  Medications  . ciprofloxacin (CIPRO) 500 MG tablet    Sig: Take 500 mg by mouth 2 (two) times daily.  Marland Kitchen HYDROcodone-acetaminophen (NORCO/VICODIN) 5-325 MG per tablet    Sig: Take 1 tablet by mouth every 6 (six) hours as needed for pain.  . ertapenem (INVANZ) 1 g in sodium chloride 0.9 % 50 mL IVPB    Sig:   . 0.9 %  sodium chloride infusion    Sig:   . sodium chloride 0.9 % bolus 1,000 mL    Sig:   . ertapenem (INVANZ) 1 g in sodium chloride 0.9 % 50 mL IVPB    Sig:    Consult urology: Dr. Gerome Sam ([phone])- discussed vital signs and labs.  Urologist states concerned that one of the impacted stones may have been infected.  Presurgical Rocephin was given and urine cultures did not yet grow anything out. He requests that patient be started on continuous IV fluids, urine culture and blood cultures drawn, and in bed his antibiotics started IV.  Plan is to get patient admitted to [facility] for observation overnight with lab recheck in the morning.  If leukocytosis and creatinine proved patient can followup as an outpatient in his office.  If labs do not improve transfer to [facility].   MDM  67 year old male to emergency Department complaining of urinary retention status post difficult stone manipulation with bilateral stent placement.  Patient found to have leukocytosis in the 20s, moderate leukocytes, and significant vomiting and creatinine.  Medications started as above.  Will admit for observation overnight with lab recheck.  Consult to urology as above.    The patient appears reasonably stabilized for admission considering the current resources, flow, and capabilities available in the ED at this time,  and I doubt any other [facility] requiring further screening and/or treatment in the ED prior to admission.         Jaci Carrel, [place] 01/02/13 0007

## 2013-01-02 ENCOUNTER — Encounter (HOSPITAL_COMMUNITY): Payer: Self-pay | Admitting: Internal Medicine

## 2013-01-02 DIAGNOSIS — N10 Acute tubulo-interstitial nephritis: Principal | ICD-10-CM

## 2013-01-02 DIAGNOSIS — R339 Retention of urine, unspecified: Secondary | ICD-10-CM

## 2013-01-02 DIAGNOSIS — N400 Enlarged prostate without lower urinary tract symptoms: Secondary | ICD-10-CM

## 2013-01-02 DIAGNOSIS — R319 Hematuria, unspecified: Secondary | ICD-10-CM

## 2013-01-02 LAB — COMPREHENSIVE METABOLIC PANEL
Albumin: 3 g/dL — ABNORMAL LOW (ref 3.5–5.2)
Alkaline Phosphatase: 68 U/L (ref 39–117)
BUN: 25 mg/dL — ABNORMAL HIGH (ref 6–23)
CO2: 24 mEq/L (ref 19–32)
Chloride: 102 mEq/L (ref 96–112)
Creatinine, Ser: 1.85 mg/dL — ABNORMAL HIGH (ref 0.50–1.35)
GFR calc Af Amer: 42 mL/min — ABNORMAL LOW (ref >90)
GFR calc non Af Amer: 37 mL/min — ABNORMAL LOW (ref >90)
Glucose, Bld: 103 mg/dL — ABNORMAL HIGH (ref 70–99)
Potassium: 4.2 mEq/L (ref 3.5–5.1)
Total Bilirubin: 0.8 mg/dL (ref 0.3–1.2)

## 2013-01-02 LAB — MAGNESIUM: Magnesium: 1.9 mg/dL (ref 1.5–2.5)

## 2013-01-02 LAB — CBC WITH DIFFERENTIAL/PLATELET
Basophils Absolute: 0 10*3/uL (ref 0.0–0.1)
Basophils Relative: 0 % (ref 0–1)
Eosinophils Absolute: 0 10*3/uL (ref 0.0–0.7)
MCH: 32.2 pg (ref 26.0–34.0)
MCHC: 35 g/dL (ref 30.0–36.0)
Monocytes Relative: 5 % (ref 3–12)
Neutro Abs: 18.8 10*3/uL — ABNORMAL HIGH (ref 1.7–7.7)
Neutrophils Relative %: 89 % — ABNORMAL HIGH (ref 43–77)
RDW: 13.3 % (ref 11.5–15.5)

## 2013-01-02 LAB — PHOSPHORUS: Phosphorus: 3.1 mg/dL (ref 2.3–4.6)

## 2013-01-02 LAB — PROTIME-INR
INR: 1.24 (ref 0.00–1.49)
Prothrombin Time: 15.4 seconds — ABNORMAL HIGH (ref 11.6–15.2)

## 2013-01-02 MED ORDER — TAMSULOSIN HCL 0.4 MG PO CAPS
0.4000 mg | ORAL_CAPSULE | Freq: Every day | ORAL | Status: DC
  Administered 2013-01-02: 0.4 mg via ORAL
  Filled 2013-01-02 (×2): qty 1

## 2013-01-02 MED ORDER — ASPIRIN EC 81 MG PO TBEC
81.0000 mg | DELAYED_RELEASE_TABLET | Freq: Every day | ORAL | Status: DC
  Administered 2013-01-02: 81 mg via ORAL
  Filled 2013-01-02: qty 1

## 2013-01-02 MED ORDER — SODIUM CHLORIDE 0.9 % IV SOLN
500.0000 mg | INTRAVENOUS | Status: DC
  Administered 2013-01-02: 0.5 g via INTRAVENOUS
  Filled 2013-01-02: qty 0.5

## 2013-01-02 MED ORDER — SODIUM CHLORIDE 0.9 % IJ SOLN
3.0000 mL | Freq: Two times a day (BID) | INTRAMUSCULAR | Status: DC
  Administered 2013-01-02: 3 mL via INTRAVENOUS

## 2013-01-02 MED ORDER — TRAMADOL HCL 50 MG PO TABS
100.0000 mg | ORAL_TABLET | Freq: Two times a day (BID) | ORAL | Status: DC | PRN
  Filled 2013-01-02: qty 2

## 2013-01-02 MED ORDER — ONDANSETRON HCL 4 MG PO TABS: 4.0000 mg | ORAL_TABLET | Freq: Four times a day (QID) | ORAL | Status: DC | PRN

## 2013-01-02 MED ORDER — HYDROCODONE-ACETAMINOPHEN 5-325 MG PO TABS: 1.0000 | ORAL_TABLET | Freq: Four times a day (QID) | ORAL | Status: DC | PRN

## 2013-01-02 MED ORDER — ONDANSETRON HCL 4 MG/2ML IJ SOLN: 4.0000 mg | Freq: Four times a day (QID) | INTRAMUSCULAR | Status: DC | PRN

## 2013-01-02 MED ORDER — MORPHINE SULFATE 2 MG/ML IJ SOLN: 1.0000 mg | INTRAMUSCULAR | Status: DC | PRN

## 2013-01-02 MED ORDER — ACETAMINOPHEN 650 MG RE SUPP: 650.0000 mg | Freq: Four times a day (QID) | RECTAL | Status: DC | PRN

## 2013-01-02 MED ORDER — ACETAMINOPHEN 325 MG PO TABS: 650.0000 mg | ORAL_TABLET | Freq: Four times a day (QID) | ORAL | Status: DC | PRN

## 2013-01-02 MED ORDER — ERTAPENEM SODIUM 1 G IJ SOLR: 1.0000 g | INTRAMUSCULAR | Status: DC

## 2013-01-02 NOTE — Progress Notes (Signed)
UR Chart Review Completed  

## 2013-01-02 NOTE — ED Notes (Signed)
Attempted to give floor report x 2

## 2013-01-02 NOTE — Evaluation (Signed)
Physical Therapy Evaluation Patient Details Name: Gary Roberts MRN: [id number] DOB: [birth year] Today's Date: 01/02/2013 Time: 1340-1403 PT Time Calculation (min): 23 min  PT Assessment / Plan / Recommendation Clinical Impression  Pt is 67 y/o male admitted due to urine retension and acute renal failure.  Pt moving well and pt close to baseline of function.  Educated pt on the importance of ambulating at least 3 times a day with family or Engineer, manufacturing.  RN notified.  Pt has no acute PT needs and PT will sign off.     PT Assessment  Patent does not need any further PT services    Does the patient have the potential to tolerate intense rehabilitation      Precautions / Restrictions Precautions Precautions: None   Pertinent Vitals/Pain C/o kidney pain but does not rate      Mobility  Bed Mobility Bed Mobility: Supine to Sit Supine to Sit: 6: Modified independent (Device/Increase time) Sit to Supine: 6: Modified independent (Device/Increase time) Details for Bed Mobility Assistance: needs extra time due to pain Transfers Transfers: Sit to Stand;Stand to Sit Sit to Stand: 6: Modified independent (Device/Increase time);From bed Stand to Sit: 6: Modified independent (Device/Increase time);To bed Details for Transfer Assistance: Needs extra time due to pain Ambulation/Gait Ambulation/Gait Assistance: 6: Modified independent (Device/Increase time) Ambulation Distance (Feet): 200 Feet Assistive device:  (pushing IV pole and holding foley catheter bag) Gait Pattern: Antalgic;Wide base of support Stairs: No    Exercises     PT Diagnosis:    PT Problem List:   PT Treatment Interventions:     PT Goals    Visit Information  Last PT Received On: 01/02/13 Assistance Needed: +1    Subjective Data  Subjective: "I run 2-3 times a week for 3 miles." Patient Stated Goal: To return to running and fishing   Prior Functioning  Home Living Lives With: Spouse Available Help at  Discharge: Family Type of Home: House Home Access: Stairs to enter [employer] of Steps: 1. 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[employer]) Communication Communication: No difficulties Dominant Hand: Right    Cognition  Cognition Arousal/Alertness: Awake/alert Behavior During Therapy: WFL for tasks assessed/performed Overall Cognitive Status: Within Functional Limits for tasks assessed    Extremity/Trunk Assessment Right Lower Extremity Assessment RLE ROM/Strength/Tone: Within functional levels Left Lower Extremity Assessment LLE ROM/Strength/Tone: Within functional levels   Balance    End of Session PT - End of Session Equipment Utilized During Treatment: Gait belt Activity Tolerance: Patient tolerated treatment well Patient left: in bed;with call bell/phone within reach;with family/visitor present Nurse Communication: Mobility status  GP     Raciel Caffrey 01/02/2013, 3:04 PM Jake Shark, PT DPT [phone]

## 2013-01-02 NOTE — Progress Notes (Signed)
Physical Therapy Discharge Patient Details Name: Gary Roberts MRN: [id number] DOB: 1946/01/12 Today's Date: 01/02/2013 Time: 1340-1403 PT Time Calculation (min): 23 min  Patient discharged from PT services secondary to goals met and no further PT needs identified.  Please see latest therapy progress note for current level of functioning and progress toward goals.    Progress and discharge plan discussed with patient and/or caregiver: Patient/Caregiver agrees with plan  GP     [name] 01/02/2013, 3:05 PM   Jake Shark, PT DPT [phone]

## 2013-01-02 NOTE — ED Notes (Signed)
Attempted to give floor report x 1  

## 2013-01-02 NOTE — Progress Notes (Signed)
[facility] PROGRESS NOTE  Gary Roberts WUJ:[id number] DOB: 04-14-1946 DOA: 01/01/2013 PCP: No PCP Per Patient  Assessment/Plan  Urinary retention in the setting of recent bilateral hydronephrosis and stone removal and stent placement on the right. -  Continue Foley catheter - Start Flomax as alternative to rapiflo -  Start flushes  Pyelonephritis resistant to fluoroquinolones - Followup urine culture  -  Continue ertapenem, if patient clinically deteriorates escalate to Primaxin as ertapenem does not have good pseudomonal coverage   Abdominal pain, likely due to bladder spasms and residual pain from stent placement -  Avoid narcotics if possible do to side effect of urinary retention -  We'll start low dose Ultram -  If spasms persist, he can talk to his urologist about medications to treat bladder spasms.  Many of these medications have a side effect of urinary retention however, or are quite expensive.   Acute kidney injury likely due to obstruction -  Minimize nephrotoxins -  Renally dose medications -  Continue IV fluids -  Obstruction relieved by catheter  Leukocytosis, likely due to urinary retention and pyelonephritis - Trend his WBCs  Normocytic anemia, likely delusional -  repeat hemoglobin in a.m.  BPH/Prostate cancer -  flomax  Diet:  Regular  Access:  PIV  IVF:  Normal saline at 75 mL per hour  Proph:  SCDs   Code Status: Full code  Family Communication: Spoke with patient and his wife  Disposition Plan: Pending improvement in white blood cell count, results of urine culture to determine which antibiotics to use as an outpatient.     Consultants:  None   Procedures:  Foley catheter placement   Antibiotics:  Ertapenem From 6/6   HPI/Subjective:  Patient states that he has not had fevers or chills recently, however he is starting to feel some cramping of a right lower quieter and right flank as well as the bladder. He is having some bladder  spasms. He denies diarrhea and states that his urine has been coming out of his Foley catheter without problem.    Objective: Filed Vitals:   01/02/13 0100 01/02/13 0130 01/02/13 0200 01/02/13 0317  BP: 116/65 117/73 118/63 133/78  Pulse: 64 64 62 65  Temp:    98.3 F (36.8 C)  TempSrc:    Oral  Resp:    18  Height:    5\' 10"  (1.778 m)  Weight:    83.553 kg (184 lb 3.2 oz)  SpO2: 95% 95% 95% 94%    Intake/Output Summary (Last 24 hours) at 01/02/13 1145 Last data filed at 01/02/13 1128  Gross per 24 hour  Intake    120 ml  Output   4775 ml  Net  -4655 ml   Filed Weights   01/01/13 1923 01/02/13 0317  Weight: 80.74 kg (178 lb) 83.553 kg (184 lb 3.2 oz)    Exam:   General:  Caucasian male,   No acute distress  HEENT:  NCAT, MMM  Cardiovascular:  RRR, nl S1, S2, 3/6 holosystolic murmur at the apex, no rg, 2+ pulses, warm extremities  Respiratory:  CTAB, no increased WOB  Abdomen:   NABS, soft, NT/ND  MSK:  Normal tone and bulk, trace LEE  Neuro:  Grossly intact  GU:  Foley catheter draining grossly clear urine with some blood clots and possibly some pieces of stone   Data Reviewed: Basic Metabolic Panel:  Recent Labs Lab 01/01/13 1924 01/02/13 0400  NA 130* 136  K 4.2 4.2  CL 94* 102  CO2 23 24  GLUCOSE 124* 103*  BUN 30* 25*  CREATININE 2.52* 1.85*  CALCIUM 9.0 8.7  MG  --  1.9  PHOS  --  3.1   Liver Function Tests:  Recent Labs Lab 01/01/13 1924 01/02/13 0400  AST 17 18  ALT 17 16  ALKPHOS 79 68  BILITOT 1.0 0.8  PROT 6.8 5.9*  ALBUMIN 3.8 3.0*   No results found for this basename: LIPASE, AMYLASE,  in the last 168 hours No results found for this basename: AMMONIA,  in the last 168 hours CBC:  Recent Labs Lab 01/01/13 1924 01/02/13 0400  WBC 22.3* 21.2*  NEUTROABS 20.4* 18.8*  HGB 14.5 12.6*  HCT 40.8 36.0*  MCV 91.7 92.1  PLT 249 213   Cardiac Enzymes: No results found for this basename: CKTOTAL, CKMB, CKMBINDEX, TROPONINI,   in the last 168 hours BNP (last 3 results) No results found for this basename: PROBNP,  in the last 8760 hours CBG:  Recent Labs Lab 01/02/13 0553  GLUCAP 112*    No results found for this or any previous visit (from the past 240 hour(s)).   Studies: No results found.  Scheduled Meds: . aspirin EC  81 mg Oral Daily  . [START ON 01/03/2013] ertapenem  500 mg Intravenous Q24H  . sodium chloride  3 mL Intravenous Q12H  . tamsulosin  0.4 mg Oral QPC supper   Continuous Infusions: . sodium chloride 125 mL/hr at 01/01/13 2343    Principal Problem:   Bilateral hydronephrosis Active Problems:   Acute renal failure   Leukocytosis   UTI (lower urinary tract infection)    Time spent: 30 min    Gary Roberts, [facility]  [facility] Pager [phone]. If 7PM-7AM, please contact night-coverage at [email or web address], password [facility] 01/02/2013, 11:45 AM  LOS: 1 day

## 2013-01-02 NOTE — Progress Notes (Signed)
pts foley being irrigated per orders, first time irrigated with 40cc and got back 40cc with sediment back, next time irrigated with 20cc and got back pink with very little sediment, will continue to monitor

## 2013-01-02 NOTE — Plan of Care (Signed)
Problem: Phase I Progression Outcomes Goal: OOB as tolerated unless otherwise ordered Outcome: Completed/Met Date Met:  01/02/13 Pt oob with PT, pt tolerates well Goal: Voiding-avoid urinary catheter unless indicated Outcome: Not Met (add Reason) Pt has foley d/t urinary retention

## 2013-01-02 NOTE — Discharge Summary (Signed)
Physician Discharge Summary  Gary Roberts XBJ:[id number] DOB: 1946-01-20 DOA: 01/01/2013  PCP: No PCP Per Patient  Admit date: 01/01/2013 Discharge date: 01/03/2013  Recommendations for Outpatient Follow-up:  1. Follow up with Dr. Merry Lofty on Monday at 2pm in [facility].   2. Follow up with primary care doctor within 1 week to repeat CBC and BMP. Please followup on the results of his blood cultures which are pending at the time of discharge.  Discharge Diagnoses:  Principal Problem:   Bilateral hydronephrosis Active Problems:   Acute renal failure   Leukocytosis   Acute pyelonephritis   BPH (benign prostatic hyperplasia)   Urinary retention   Hematuria   Discharge Condition: Stable, improved  Diet recommendation: Regular  Wt Readings from Last 3 Encounters:  01/03/13 81.285 kg (179 lb 3.2 oz)  08/04/12 81.012 kg (178 lb 9.6 oz)    History of present illness:  67 year old male with past medical history of prostate cancer under observation, BPH, recent kidney stone removal (12/31/2012 at [facility]) and bilateral hydronephrosis with stent placement who comes is with complaints of no urine output for 1 day since his stone extraction. Patient reported he was taking cipro but even with that he still had subjective fever at home and could not urinate. He also had hematuria. No other GU complaints. No chest pain, no shortness of breath, no palpitations. No diarrhea, no constipation. No blood in stool.   In ED, BP 138/76, HR 75 and Tmax 100.38F. CBC revealed leukocytosis of 22.3. BMP revealed acute creatinine elevation of 2.52. ED physician spoke with patient's primary urologist (Dr. Merry Lofty) who asked for the admission and to start patient on Invanz and obtain blood cultures.    Hospital Course:   Urinary retention in the setting of recent bilateral hydronephrosis and stone removal and stent placement on the right.  A Foley catheter was placed and copious urine was drained.  He had been on rapaflo and was transitioned to a Flomax while in the hospital. He may resume his rapid flow 8 mg daily when he goes home. Because he had some blood in his urine, he was instructed on how to flush his hearing catheter to dislodge blood clots. If he is unsuccessful in dislodging a blood clot if he has decreased urine output, he is instructed to come back to the emergency department. He has a followup appointment with his urologist in 2 days.    Suspected Pyelonephritis.  He had been on ciprofloxacin as an outpatient, so there was concern for extended spectrum beta-lactamase producing bacterial infection. He was started on her ertapenem. His white blood cell count trended down, however his urine culture was negative. I believe that his leukocytosis and white blood cell count in his urine or probably due to inflammation from acute urinary tract obstruction. He may resume his ciprofloxacin at home.    Abdominal pain, likely due to bladder spasms and residual pain from stent placement.  Advised him to avoid narcotics if possible to avoid the side effect of urinary retention. I offered him some ultram, that he did not want to take the medication.  He declined a prescription for home and his abdominal pain improved. If spasms persist, he can talk to his urologist about medications to treat bladder spasms they do not have significant side effect of urinary retention.    Acute kidney injury likely due to obstruction.  He is advised not to use any ibuprofen or Naprosyn currently.  He should  stay well hydrated, and make sure that his labs are rechecked in approximately one week.  His creatinine trended down from 2.5-1.18  Leukocytosis, likely due to urinary retention and pyelonephritis.  Resolving with relief of urinary obstruction.    Normocytic anemia, likely dilutional, and should be repeated in one week.  He may have some mild anemia also from blood loss in his urine.  BPH/Prostate cancer.   Rapaflo as above.   Consultants:  None  Procedures:  Foley catheter placement  Antibiotics:  Ertapenem From 6/6 to 6/7   Discharge Exam: Filed Vitals:   01/03/13 0535  BP: 139/72  Pulse: 69  Temp: 98.2 F (36.8 C)  Resp: 18   Filed Vitals:   01/02/13 0317 01/02/13 1425 01/02/13 2126 01/03/13 0535  BP: 133/78 117/72 128/76 139/72  Pulse: 65 70 67 69  Temp: 98.3 F (36.8 C) 98.4 F (36.9 C) 99 F (37.2 C) 98.2 F (36.8 C)  TempSrc: Oral Oral Oral Oral  Resp: 18 14 18 18   Height: 5\' 10"  (1.778 m)     Weight: 83.553 kg (184 lb 3.2 oz)   81.285 kg (179 lb 3.2 oz)  SpO2: 94% 94% 95% 94%   Patient states that he has had improvement in his abdominal pain since yesterday. He has not required any narcotics or Ultram.  He denies fevers and chills currently.    General: Caucasian male, No acute distress  HEENT: NCAT, MMM  Cardiovascular: RRR, nl S1, S2, 3/6 holosystolic murmur at the apex, no rg, 2+ pulses, warm extremities  Respiratory: CTAB, no increased WOB  Abdomen: NABS, soft, ND, mild tenderness to palpation diffusely without rebound or guarding  MSK: Normal tone and bulk, trace LEE  Neuro: Grossly intact GU: Foley catheter draining some blood-tinged urine.   Discharge Instructions      Discharge Orders   Future Orders Complete By Expires     Call MD for:  difficulty breathing, headache or visual disturbances  As directed     Call MD for:  extreme fatigue  As directed     Call MD for:  hives  As directed     Call MD for:  persistant dizziness or light-headedness  As directed     Call MD for:  persistant nausea and vomiting  As directed     Call MD for:  severe uncontrolled pain  As directed     Call MD for:  temperature >100.4  As directed     Diet general  As directed     Discharge instructions  As directed     Comments:      You were hospitalized with urinary retention, high white blood cell count, and some mild renal failure.  After placement of a Foley  catheter in starting on broad-spectrum antibiotics, you have slowly improved. Your white blood cell count has come down and your kidney function has improved. You have had some blood in your urine which can sometimes formed clots in your bladder which obstructs your Foley catheter. Please make sure that you continue to have urine draining out into your Foley bag. If you notice this urine coming out, please try to flush your urine catheter with saline.  If flushing with saline does not cause urine to start to come out, please return to the emergency department.  Your urine culture did not grow any bacteria, so you may resume your previous course of ciprofloxacin.  Resume the rapaflo prescribed to you by your urologist.  Increase activity slowly  As directed         Medication List    STOP taking these medications       aspirin EC 81 MG tablet      TAKE these medications       ciprofloxacin 500 MG tablet  Commonly known as:  CIPRO  Take 500 mg by mouth 2 (two) times daily.     HYDROcodone-acetaminophen 5-325 MG per tablet  Commonly known as:  NORCO/VICODIN  Take 1 tablet by mouth every 6 (six) hours as needed for pain.     silodosin 8 MG Caps capsule  Commonly known as:  RAPAFLO  Take 1 capsule (8 mg total) by mouth daily with breakfast.       Follow-up Information   Follow up with [facility] On 01/05/2013. (2PM)       The results of significant diagnostics from this hospitalization (including imaging, microbiology, ancillary and laboratory) are listed below for reference.    Significant Diagnostic Studies: No results found.  Microbiology: Recent Results (from the past 240 hour(s))  URINE CULTURE     Status: None   Collection Time    01/01/13  9:58 PM      Result Value Range Status   Specimen Description URINE, RANDOM   Final   Special Requests Normal   Final   Culture  Setup Time 01/02/2013 07:44   Final   Colony Count NO GROWTH   Final   Culture NO GROWTH   Final    Report Status 01/03/2013 FINAL   Final     Labs: Basic Metabolic Panel:  Recent Labs Lab 01/01/13 1924 01/02/13 0400 01/03/13 0530  NA 130* 136 139  K 4.2 4.2 4.3  CL 94* 102 106  CO2 23 24 25   GLUCOSE 124* 103* 93  BUN 30* 25* 16  CREATININE 2.52* 1.85* 1.18  CALCIUM 9.0 8.7 8.5  MG  --  1.9  --   PHOS  --  3.1  --    Liver Function Tests:  Recent Labs Lab 01/01/13 1924 01/02/13 0400  AST 17 18  ALT 17 16  ALKPHOS 79 68  BILITOT 1.0 0.8  PROT 6.8 5.9*  ALBUMIN 3.8 3.0*   No results found for this basename: LIPASE, AMYLASE,  in the last 168 hours No results found for this basename: AMMONIA,  in the last 168 hours CBC:  Recent Labs Lab 01/01/13 1924 01/02/13 0400 01/03/13 0530  WBC 22.3* 21.2* 12.6*  NEUTROABS 20.4* 18.8*  --   HGB 14.5 12.6* 11.8*  HCT 40.8 36.0* 33.9*  MCV 91.7 92.1 92.6  PLT 249 213 203   Cardiac Enzymes: No results found for this basename: CKTOTAL, CKMB, CKMBINDEX, TROPONINI,  in the last 168 hours BNP: BNP (last 3 results) No results found for this basename: PROBNP,  in the last 8760 hours CBG:  Recent Labs Lab 01/02/13 0553  GLUCAP 112*    Time coordinating discharge: 45 minutes  Signed:  Francessca Friis  [facility] 01/03/2013, 8:36 AM

## 2013-01-03 DIAGNOSIS — R339 Retention of urine, unspecified: Secondary | ICD-10-CM

## 2013-01-03 LAB — BASIC METABOLIC PANEL
BUN: 16 mg/dL (ref 6–23)
Chloride: 106 mEq/L (ref 96–112)
Creatinine, Ser: 1.18 mg/dL (ref 0.50–1.35)
GFR calc Af Amer: 73 mL/min — ABNORMAL LOW (ref >90)

## 2013-01-03 LAB — CBC
HCT: 33.9 % — ABNORMAL LOW (ref 39.0–52.0)
Hemoglobin: 11.8 g/dL — ABNORMAL LOW (ref 13.0–17.0)
MCH: 32.2 pg (ref 26.0–34.0)
MCHC: 34.8 g/dL (ref 30.0–36.0)
MCV: 92.6 fL (ref 78.0–100.0)
Platelets: 203 K/uL (ref 150–400)
RBC: 3.66 MIL/uL — ABNORMAL LOW (ref 4.22–5.81)
RDW: 13.6 % (ref 11.5–15.5)
WBC: 12.6 K/uL — ABNORMAL HIGH (ref 4.0–10.5)

## 2013-01-03 LAB — URINE CULTURE

## 2013-01-03 MED ORDER — SILODOSIN 8 MG PO CAPS: 8.0000 mg | ORAL_CAPSULE | Freq: Every day | ORAL | Status: DC

## 2013-01-03 NOTE — Progress Notes (Signed)
Pt and family given instructions on how to irrigate foley and how to perform foley care, pt and family verbalized understanding, pt given supplies for home, dc instructions given to pt on medication and follow up appointments, pt verbalized understanding, pt left via wheelchair with family to home, pt stable upon dc

## 2013-01-03 NOTE — ED Provider Notes (Signed)
Medical screening examination/treatment/procedure(s) were conducted as a shared visit with non-physician practitioner(s) and myself.  I personally evaluated the patient during the encounter  Patient seen by me. Again plan was to discuss with his urologist at [facility] once we got some of the lab information back that was done they recommended the IV antibiotics and 24 hour observation. Patient status post bilateral stent placement for kidney stones patient's had some low-grade fevers MAXIMUM TEMPERATURE was 100.3 and increased right-sided flank pain. Patient not toxic but overall concern for significant infection developing is present.  Shelda Jakes, MD 01/03/13 1001

## 2013-01-03 NOTE — Progress Notes (Signed)
Patient refused 0000 irrigation.  Patient resting comfortably at beside.  Wife at bedside. RN will continue to monitor. Louretta Parma, RN

## 2013-01-08 LAB — CULTURE, BLOOD (ROUTINE X 2): Culture: NO GROWTH

## 2013-05-27 ENCOUNTER — Encounter: Payer: Medicare Other | Admitting: Family Medicine

## 2013-10-17 ENCOUNTER — Other Ambulatory Visit: Payer: Self-pay | Admitting: Internal Medicine

## 2014-04-07 IMAGING — CR DG CHEST 2V
2 series · 2 of 2 positions shown · non-contrast
Comparison: 08/01/2006

CLINICAL DATA: Cough.  Weakness.

CHEST - 2 VIEW

[w chest pa]
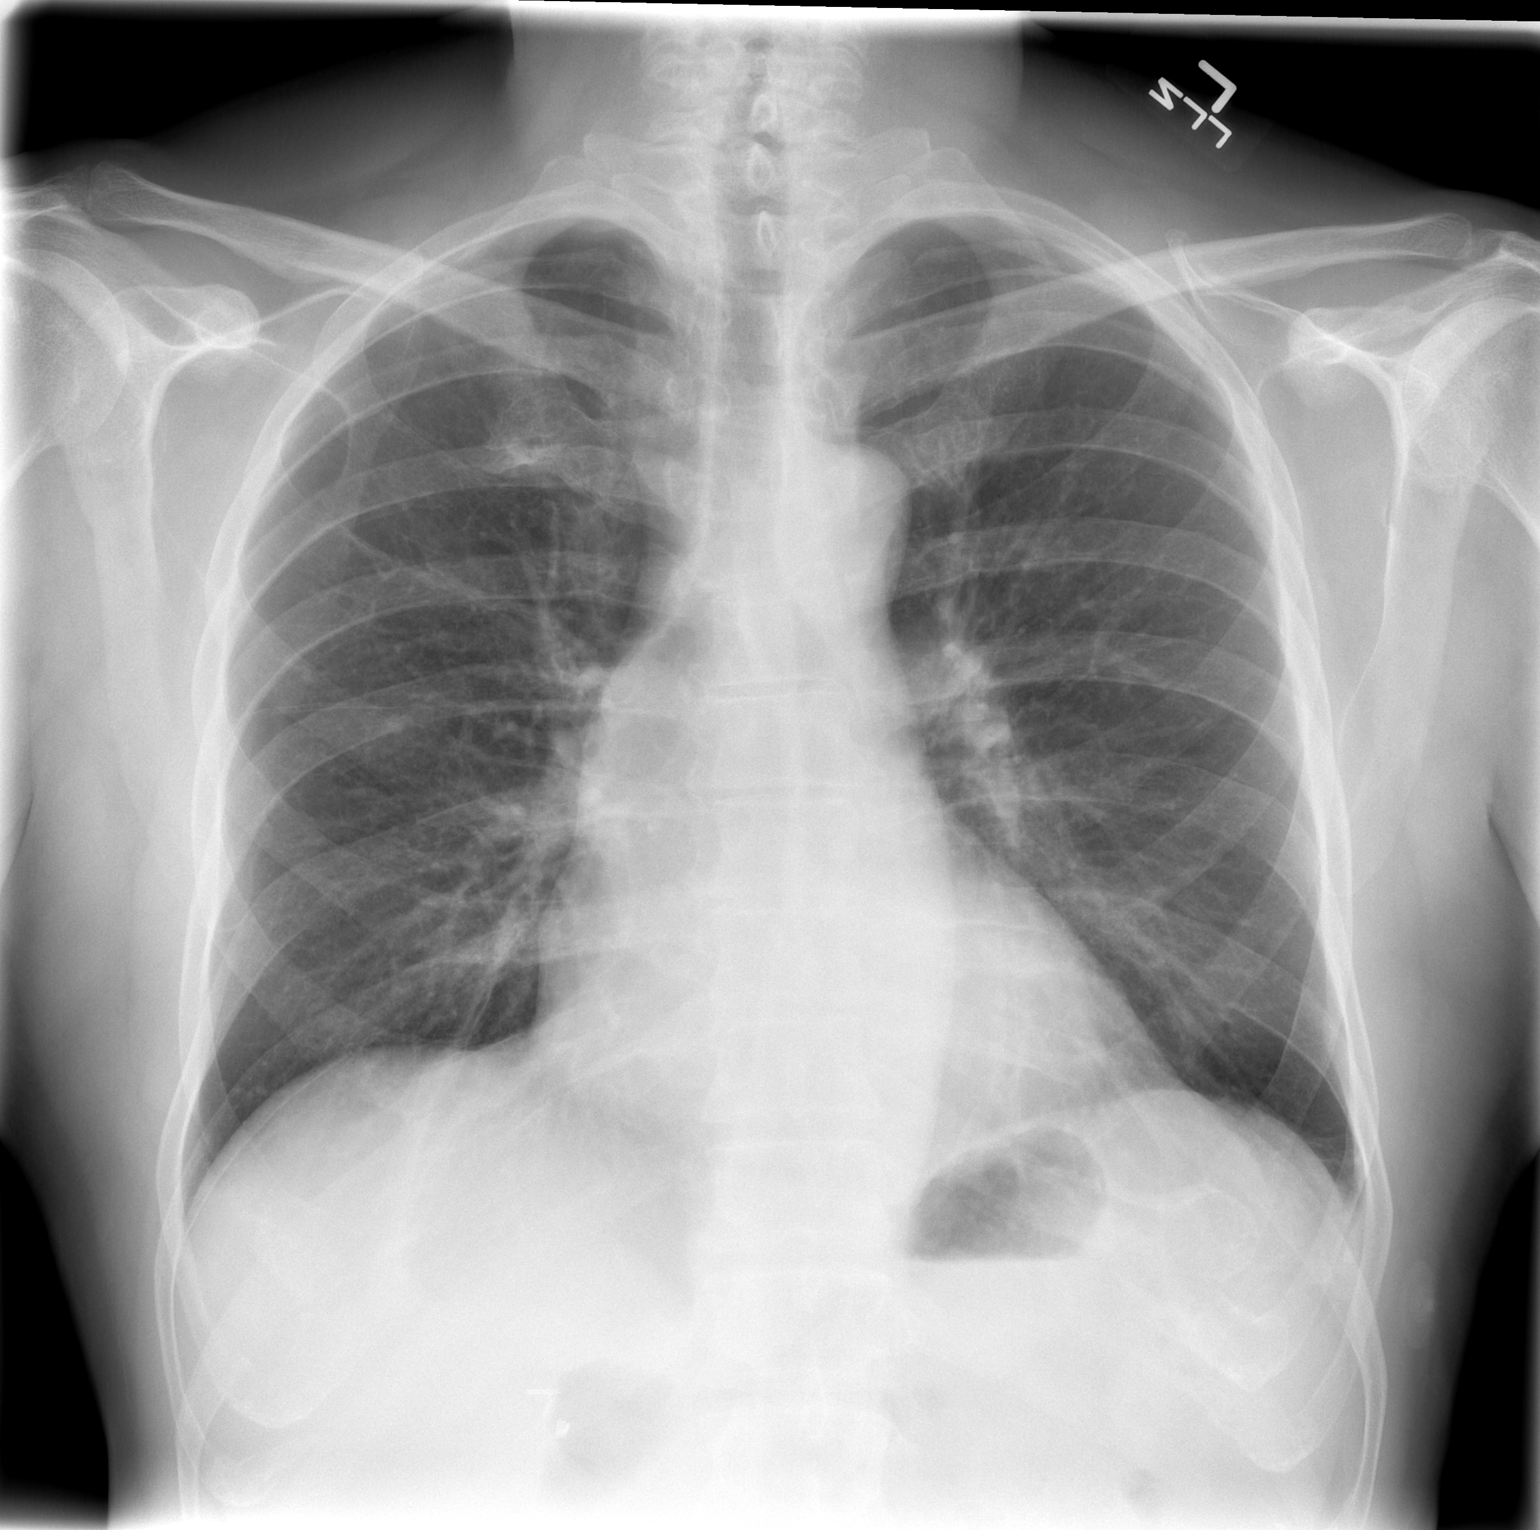

[w chest lat]
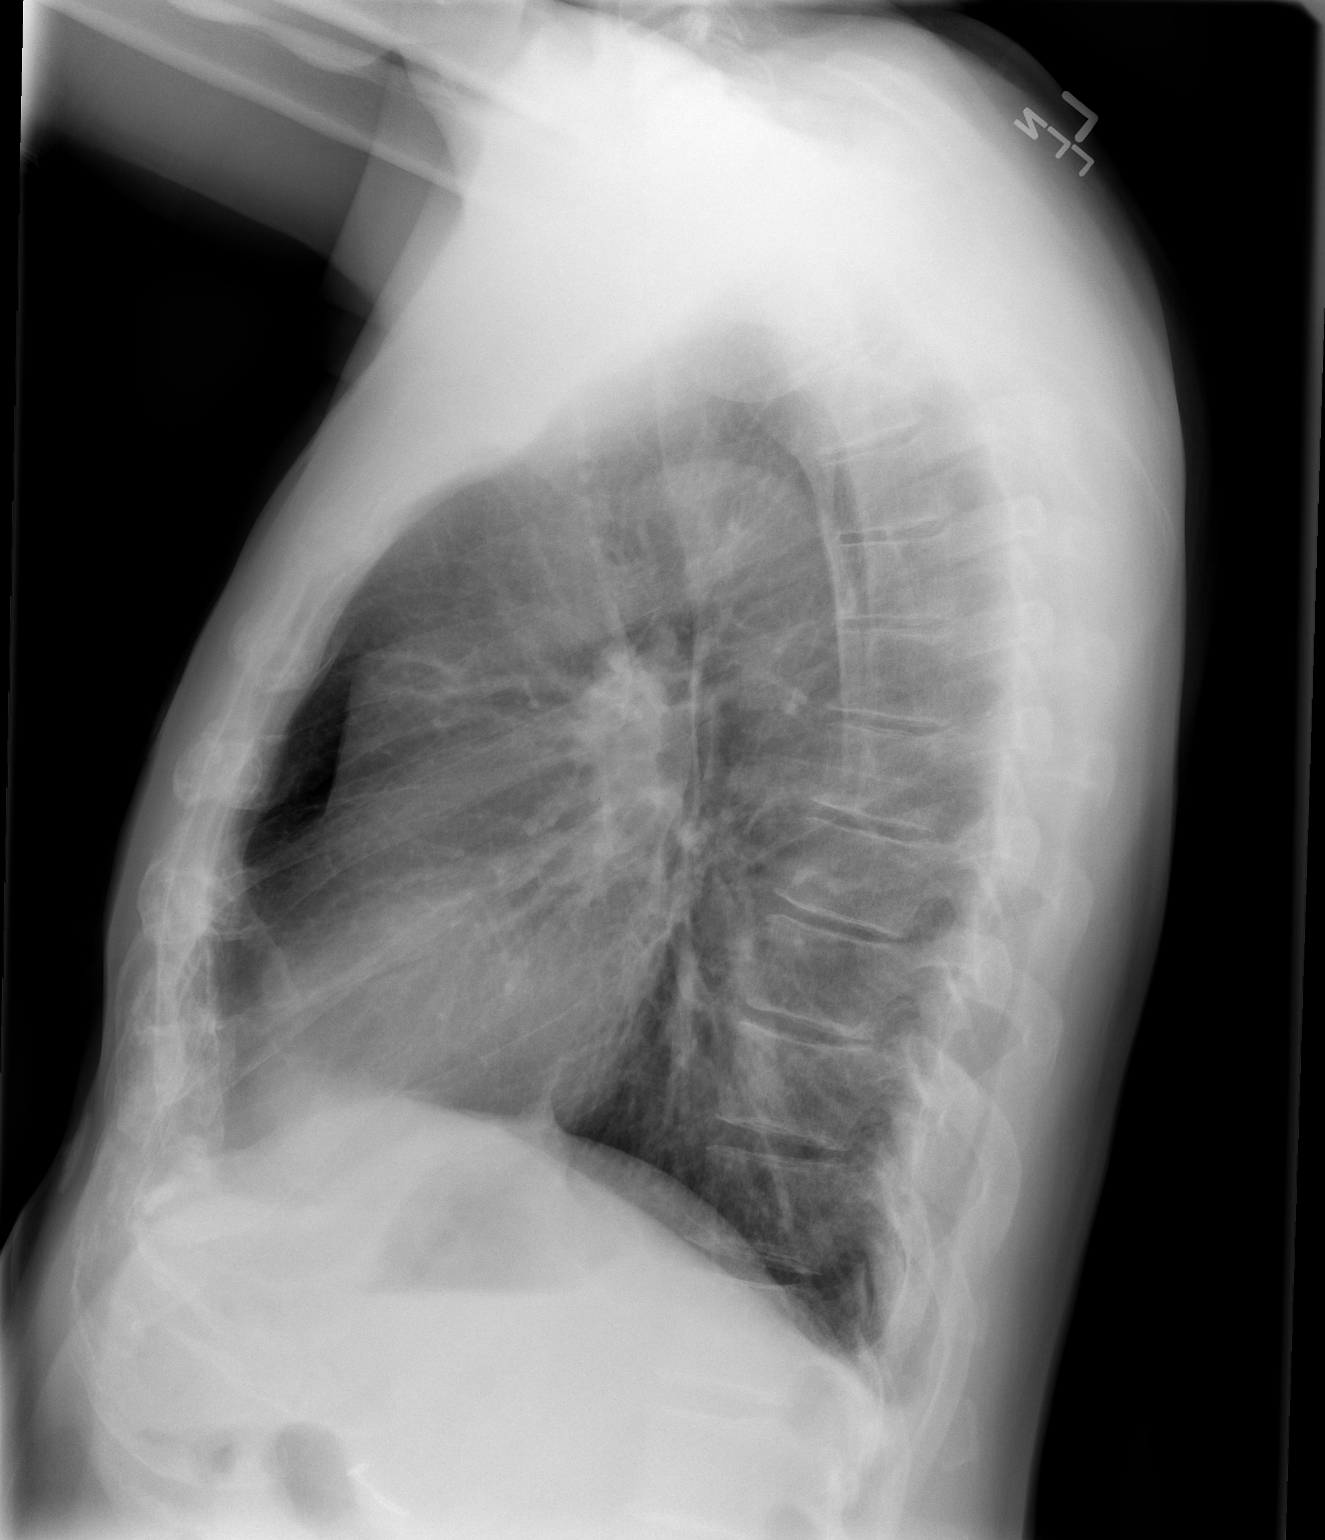

[2 of 2 positions shown; findings below may reference images not displayed]

FINDINGS: The heart size and mediastinal contours are within
normal limits.  Both lungs are clear.  The visualized skeletal
structures are unremarkable.
IMPRESSION: No active cardiopulmonary disease.

## 2014-08-24 DIAGNOSIS — C61 Malignant neoplasm of prostate: Secondary | ICD-10-CM | POA: Diagnosis not present

## 2014-08-24 DIAGNOSIS — E291 Testicular hypofunction: Secondary | ICD-10-CM | POA: Diagnosis not present

## 2014-09-10 ENCOUNTER — Ambulatory Visit (INDEPENDENT_AMBULATORY_CARE_PROVIDER_SITE_OTHER): Payer: Medicare Other | Admitting: Family Medicine

## 2014-09-10 ENCOUNTER — Encounter: Payer: Self-pay | Admitting: Family Medicine

## 2014-09-10 VITALS — BP 132/88 | HR 57 | Temp 97.8°F | Resp 16 | Ht 69.5 in | Wt 182.0 lb

## 2014-09-10 DIAGNOSIS — R7989 Other specified abnormal findings of blood chemistry: Secondary | ICD-10-CM | POA: Diagnosis not present

## 2014-09-10 DIAGNOSIS — R799 Abnormal finding of blood chemistry, unspecified: Secondary | ICD-10-CM | POA: Diagnosis not present

## 2014-09-10 DIAGNOSIS — Z1211 Encounter for screening for malignant neoplasm of colon: Secondary | ICD-10-CM

## 2014-09-10 DIAGNOSIS — I38 Endocarditis, valve unspecified: Secondary | ICD-10-CM

## 2014-09-10 DIAGNOSIS — E786 Lipoprotein deficiency: Secondary | ICD-10-CM | POA: Diagnosis not present

## 2014-09-10 DIAGNOSIS — Z0000 Encounter for general adult medical examination without abnormal findings: Secondary | ICD-10-CM

## 2014-09-10 LAB — CBC WITH DIFFERENTIAL/PLATELET
BASOS ABS: 0.1 10*3/uL (ref 0.0–0.1)
Basophils Relative: 1 % (ref 0–1)
EOS PCT: 1 % (ref 0–5)
Eosinophils Absolute: 0.1 10*3/uL (ref 0.0–0.7)
HCT: 47.2 % (ref 39.0–52.0)
HEMOGLOBIN: 16.6 g/dL (ref 13.0–17.0)
LYMPHS PCT: 21 % (ref 12–46)
Lymphs Abs: 1.6 10*3/uL (ref 0.7–4.0)
MCH: 32.9 pg (ref 26.0–34.0)
MCHC: 35.2 g/dL (ref 30.0–36.0)
MCV: 93.5 fL (ref 78.0–100.0)
MONO ABS: 0.6 10*3/uL (ref 0.1–1.0)
MPV: 11.1 fL (ref 8.6–12.4)
Monocytes Relative: 8 % (ref 3–12)
NEUTROS PCT: 69 % (ref 43–77)
Neutro Abs: 5.1 10*3/uL (ref 1.7–7.7)
PLATELETS: 220 10*3/uL (ref 150–400)
RBC: 5.05 MIL/uL (ref 4.22–5.81)
RDW: 14 % (ref 11.5–15.5)
WBC: 7.4 10*3/uL (ref 4.0–10.5)

## 2014-09-10 NOTE — Patient Instructions (Addendum)
Keeping you healthy  Get these tests  Blood pressure- Have your blood pressure checked once a year by your healthcare provider.  Normal blood pressure is 120/80  Weight- Have your body mass index (BMI) calculated to screen for obesity.  BMI is a measure of body fat based on height and weight. You can also calculate your own BMI at ViewBanking.si.  Cholesterol- Have your cholesterol checked every year.  Diabetes- Have your blood sugar checked regularly if you have high blood pressure, high cholesterol, have a family history of diabetes or if you are overweight.  Screening for Colon Cancer- Colonoscopy starting at age 64.  Screening may begin sooner depending on your family history and other health conditions. Follow up colonoscopy as directed by your Gastroenterologist.  Screening for Prostate Cancer- Both blood work (PSA) and a rectal exam help screen for Prostate Cancer.  Screening begins at age 80 with African-American men and at age 78 with Caucasian men.  Screening may begin sooner depending on your family history.  Take these medicines  Aspirin- One aspirin daily can help prevent Heart disease and Stroke.  Flu shot- Every fall.  Tetanus- Every 10 years.  Zostavax- Once after the age of 65 to prevent Shingles.  Pneumonia shot- Once after the age of 15; if you are younger than 66, ask your healthcare provider if you need a Pneumonia shot.  Take these steps  Don't smoke- If you do smoke, talk to your doctor about quitting.  For tips on how to quit, go to www.smokefree.gov or call 1-800-QUIT-NOW.  Be physically active- Exercise 5 days a week for at least 30 minutes.  If you are not already physically active start slow and gradually work up to 30 minutes of moderate physical activity.  Examples of moderate activity include walking briskly, mowing the yard, dancing, swimming, bicycling, etc.  Eat a healthy diet- Eat a variety of healthy food such as fruits, vegetables, low  fat milk, low fat cheese, yogurt, lean meant, poultry, fish, beans, tofu, etc. For more information go to www.thenutritionsource.org  Drink alcohol in moderation- Limit alcohol intake to less than two drinks a day. Never drink and drive.  Dentist- Brush and floss twice daily; visit your dentist twice a year.  Depression- Your emotional health is as important as your physical health. If you're feeling down, or losing interest in things you would normally enjoy please talk to your healthcare provider.  Eye exam- Visit your eye doctor every year.  Safe sex- If you may be exposed to a sexually transmitted infection, use a condom.  Seat belts- Seat belts can save your life; always wear one.  Smoke/Carbon Monoxide detectors- These detectors need to be installed on the appropriate level of your home.  Replace batteries at least once a year.  Skin cancer- When out in the sun, cover up and use sunscreen 15 SPF or higher.  Violence- If anyone is threatening you, please tell your healthcare provider.  Living Will/ Health care power of attorney- Speak with your healthcare provider and family.   The exam today shows that you are in good health; please consider having a repeat colonoscopy, especially if you return positive stool tests to [place].

## 2014-09-11 LAB — COMPLETE METABOLIC PANEL WITH GFR
ALT: 21 U/L (ref 0–53)
AST: 20 U/L (ref 0–37)
Albumin: 4.2 g/dL (ref 3.5–5.2)
Alkaline Phosphatase: 77 U/L (ref 39–117)
BUN: 15 mg/dL (ref 6–23)
CHLORIDE: 103 meq/L (ref 96–112)
CO2: 28 mEq/L (ref 19–32)
CREATININE: 0.92 mg/dL (ref 0.50–1.35)
Calcium: 9.5 mg/dL (ref 8.4–10.5)
GFR, Est African American: >89 mL/min
GFR, Est Non African American: 86 mL/min
GLUCOSE: 87 mg/dL (ref 70–99)
POTASSIUM: 4.6 meq/L (ref 3.5–5.3)
Sodium: 142 mEq/L (ref 135–145)
Total Bilirubin: 1 mg/dL (ref 0.2–1.2)
Total Protein: 6.7 g/dL (ref 6.0–8.3)

## 2014-09-11 LAB — LIPID PANEL
CHOL/HDL RATIO: 4.7 ratio
Cholesterol: 163 mg/dL (ref 0–200)
HDL: 35 mg/dL — ABNORMAL LOW (ref >39)
LDL CALC: 111 mg/dL — AB (ref 0–99)
TRIGLYCERIDES: 84 mg/dL (ref <150)
VLDL: 17 mg/dL (ref 0–40)

## 2014-09-12 ENCOUNTER — Encounter: Payer: Self-pay | Admitting: Family Medicine

## 2014-09-12 NOTE — Progress Notes (Signed)
Subjective:    Patient ID: Gary Roberts, male    DOB: [birth year], 69 y.o.   MRN: [id number]  HPI  This 69 y.o. Male is here for CPE. He is in good health but does have daily L leg pain related to injury and surgery many years ago. This gentleman does not endorse much medical intervention. He maintains healthy nutrition and exercises /runs most days of the week. 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 He declines all immunizations as well as referral for repeat CRS (2007 was normal and he does not want to have procedure again). He reports that he sees a specialist re: prostate problems.   Patient Active Problem List   Diagnosis Date Noted  . Mild valvular heart disease 09/10/2014  . Low HDL (under 40) 09/10/2014  . BPH (benign prostatic hyperplasia) 01/02/2013  . Urinary retention 01/02/2013  . Bilateral hydronephrosis 01/01/2013  . Acute renal failure 01/01/2013  . Leukocytosis 01/01/2013    Prior to Admission medications   Medication Sig Start Date End Date Taking? Authorizing Provider  aspirin 81 MG tablet Take 81 mg by mouth daily.   Yes Historical Provider, MD  vitamin B-12 (CYANOCOBALAMIN) 1000 MCG tablet Take 1,000 mcg by mouth daily.   Yes Historical Provider, MD    Past Surgical History  Procedure Laterality Date  . Cholecystectomy    . Kidney stone surgery    . Fracture surgery Left     Fell from a significant height > leg fracture requiring pins and plating.   History   Social History  . Marital Status: Married    Spouse Name: N/A  . Number of Children: 3  . Years of Education: N/A   Occupational History  . Food Service    Social History Main Topics  . Smoking status: Never Smoker   . Smokeless tobacco: Not on file  . Alcohol Use: No  . Drug Use: No  . Sexual Activity:    Partners: Female   Other Topics Concern  . Not on file   Social History Narrative    Family History  Problem Relation Age of Onset  . Diabetes Mother   .  Hyperlipidemia Mother   . Cancer Brother   . Heart disease Brother     Review of Systems  Constitutional: Negative.   HENT: Negative.   Eyes: Positive for visual disturbance.       Uses readers- "the cheapest I can find".  Respiratory: Negative.   Cardiovascular: Negative.   Gastrointestinal: Negative.   Endocrine: Negative.   Genitourinary: Negative.   Musculoskeletal: Positive for back pain and arthralgias.  Skin: Negative.   Allergic/Immunologic: Negative.   Neurological: Negative.   Hematological: Negative.   Psychiatric/Behavioral: Negative.        Objective:   Physical Exam  Constitutional: He is oriented to person, place, and time. Vital signs are normal. He appears well-developed and well-nourished. No distress.  Blood pressure 132/88, pulse 57, temperature 97.8 F (36.6 C), temperature source Oral, resp. rate 16, height 5' 9.5" (1.765 m), weight 182 lb (82.555 kg), SpO2 98 %.   HENT:  Head: Normocephalic and atraumatic.  Right Ear: Hearing, tympanic membrane, external ear and ear canal normal.  Left Ear: Hearing, tympanic membrane, external ear and ear canal normal.  Nose: Nose normal. No nasal deformity or septal deviation.  Mouth/Throat: Uvula is midline, oropharynx is clear and moist and mucous membranes are normal. No oral lesions. Normal dentition.  Eyes: Conjunctivae,  EOM and lids are normal. Pupils are equal, round, and reactive to light. No scleral icterus.  Neck: Trachea normal, normal range of motion, full passive range of motion without pain and phonation normal. Neck supple. No JVD present. No spinous process tenderness and no muscular tenderness present. Carotid bruit is not present. No thyroid mass and no thyromegaly present.  Cardiovascular: Normal rate, regular rhythm, S1 normal, S2 normal, intact distal pulses and normal pulses.   No extrasystoles are present. PMI is not displaced.  Exam reveals no gallop and no friction rub.   Murmur  heard. Pulmonary/Chest: Effort normal and breath sounds normal. No respiratory distress. He has no decreased breath sounds. He has no wheezes.  Abdominal: Soft. Normal appearance and bowel sounds are normal. He exhibits no distension, no abdominal bruit, no pulsatile midline mass and no mass. There is no hepatosplenomegaly. There is no tenderness. There is no guarding and no CVA tenderness. No hernia.  Genitourinary:  Deferred.  Musculoskeletal:       Right ankle: Normal.       Left ankle: He exhibits swelling and deformity. No tenderness. Achilles tendon normal.       Cervical back: Normal.       Thoracic back: Normal.       Lumbar back: Normal.  Remainder of exam unremarkable.  Lymphadenopathy:       Head (right side): No submental, no submandibular, no tonsillar, no preauricular, no posterior auricular and no occipital adenopathy present.       Head (left side): No submental, no submandibular, no tonsillar, no preauricular, no posterior auricular and no occipital adenopathy present.    He has no cervical adenopathy.    He has no axillary adenopathy.       Right: No inguinal and no supraclavicular adenopathy present.       Left: No inguinal and no supraclavicular adenopathy present.  Neurological: He is alert and oriented to person, place, and time. He has normal strength and normal reflexes. He displays no atrophy. No cranial nerve deficit or sensory deficit. He exhibits normal muscle tone. He displays a negative Romberg sign. Coordination and gait normal.  GET UP and GO test: time= 7 sec.  Skin: Skin is warm, dry and intact. No ecchymosis, no lesion and no rash noted. He is not diaphoretic. No cyanosis or erythema. No pallor. Nails show no clubbing.  L lower leg scars- well healed.  Psychiatric: He has a normal mood and affect. His speech is normal and behavior is normal. Judgment and thought content normal. Cognition and memory are normal.  Nursing note and vitals reviewed.       Assessment & Plan:  Medicare annual wellness visit, subsequent  Abnormal CBC - WBC as high as 22K in June 2014. Pt asymptomatic. Plan: CBC with Differential/Platelet, COMPLETE METABOLIC PANEL WITH GFR  Low HDL (under 40) - Advised ongoing healthy lifestyle. Plan: Lipid panel, COMPLETE METABOLIC PANEL WITH GFR  Mild valvular heart disease- Stable; pt asymptomatic.  Screening for colon cancer - Pt declines to have repeat CRS in 2017. He agrees to do home stool collection. Plan: IFOBT POC (occult bld, rslt in office), IFOBT POC (occult bld, rslt in office), IFOBT POC (occult bld, rslt in office)

## 2014-09-12 NOTE — Progress Notes (Signed)
Quick Note:  Please mail to pt... All lab values are normal except HDL ("good") cholesterol is too low; the higher this number the better with goal being 50. It is better than 2 years ago. Continue healthy nutrition and regular exercise.  Blood counts are normal; the white cell count had been above normal but is now normal. Metabolic panel (salts, blood sugar, kidney and liver function) is normal.  Copy to pt. ______

## 2014-09-13 ENCOUNTER — Encounter: Payer: Self-pay | Admitting: Family Medicine

## 2014-09-26 LAB — IFOBT (OCCULT BLOOD)
IFOBT: NEGATIVE
IFOBT: NEGATIVE
IFOBT: NEGATIVE

## 2014-09-26 NOTE — Progress Notes (Signed)
Quick Note:  Please advise pt regarding following labs... Home stool test is negative for blood.   ______

## 2014-10-01 ENCOUNTER — Telehealth: Payer: Self-pay

## 2014-10-01 NOTE — Telephone Encounter (Signed)
Pt called about labs. See labs

## 2015-01-02 ENCOUNTER — Ambulatory Visit (INDEPENDENT_AMBULATORY_CARE_PROVIDER_SITE_OTHER): Payer: Medicare Other | Admitting: Physician Assistant

## 2015-01-02 VITALS — BP 130/82 | HR 65 | Temp 98.8°F | Resp 19 | Ht 69.25 in | Wt 174.0 lb

## 2015-01-02 DIAGNOSIS — J189 Pneumonia, unspecified organism: Secondary | ICD-10-CM

## 2015-01-02 MED ORDER — AZITHROMYCIN 250 MG PO TABS: ORAL_TABLET | ORAL | Status: AC

## 2015-01-02 MED ORDER — HYDROCOD POLST-CPM POLST ER 10-8 MG/5ML PO SUER: 5.0000 mL | Freq: Two times a day (BID) | ORAL | Status: DC | PRN

## 2015-01-02 NOTE — Progress Notes (Signed)
[facility] [address], [place] [address] [phone]  Date:  01/02/2015   Name:  Gary Roberts   DOB:  1945-10-16   MRN:  [id number]  PCP:  No PCP Per Patient    Chief Complaint: Cough   History of Present Illness:  This is a 69 y.o. male with PMH BPH who is presenting with cough x 1 week. Coughing up grey mucous. Wife reports he is soaking bed sheets at night. States his symptoms are staying the same. He has not have a fever. He denies SOB, wheezing or CP. Taking delsym and nyquil for symptoms.   No history asthma. Not a smoker. No history of allergies.  Pt also asking about insect bites. He shows me one on his upper right thigh. Been happening for a few weeks. He gardens a lot. He is wondering if I might know which insect is biting him. Wife is also getting bites. They just recently bought a new mattress.   Review of Systems:  Review of Systems  Constitutional: Positive for diaphoresis. Negative for fever and chills.  HENT: Negative for congestion, ear pain and tinnitus.   Eyes: Negative for redness.  Respiratory: Positive for cough. Negative for shortness of breath and wheezing.   Gastrointestinal: Negative for nausea and vomiting.  Skin: Negative for rash.  Allergic/Immunologic: Negative for environmental allergies.  Hematological: Negative for adenopathy.  Psychiatric/Behavioral: Positive for sleep disturbance.    Patient Active Problem List   Diagnosis Date Noted  . Mild valvular heart disease 09/10/2014  . Low HDL (under 40) 09/10/2014  . BPH (benign prostatic hyperplasia) 01/02/2013  . Urinary retention 01/02/2013  . Bilateral hydronephrosis 01/01/2013  . Acute renal failure 01/01/2013  . Leukocytosis 01/01/2013    Prior to Admission medications   Medication Sig Start Date End Date Taking? Authorizing Provider  aspirin 81 MG tablet Take 81 mg by mouth daily.   Yes Historical Provider, MD  vitamin B-12 (CYANOCOBALAMIN) 1000 MCG tablet  Take 1,000 mcg by mouth daily.   Yes Historical Provider, MD    No Known Allergies  Past Surgical History  Procedure Laterality Date  . Cholecystectomy    . Kidney stone surgery    . Fracture surgery Left     Fell from a significant height > leg fracture requiring pins and plating.    History  Substance Use Topics  . Smoking status: Never Smoker   . Smokeless tobacco: Not on file  . Alcohol Use: No    Family History  Problem Relation Age of Onset  . Diabetes Mother   . Hyperlipidemia Mother   . Cancer Brother   . Heart disease Brother     Medication list has been reviewed and updated.  Physical Examination:  Physical Exam  Constitutional: He is oriented to person, place, and time. He appears well-developed and well-nourished. No distress.  HENT:  Head: Normocephalic and atraumatic.  Right Ear: Hearing, tympanic membrane, external ear and ear canal normal.  Left Ear: Hearing, tympanic membrane, external ear and ear canal normal.  Nose: Nose normal.  Mouth/Throat: Uvula is midline, oropharynx is clear and moist and mucous membranes are normal.  Eyes: Conjunctivae and lids are normal. Right eye exhibits no discharge. Left eye exhibits no discharge. No scleral icterus.  Cardiovascular: Normal rate, regular rhythm, normal heart sounds, intact distal pulses and normal pulses.   No murmur heard. Pulmonary/Chest: Effort normal. No respiratory distress. He has wheezes. He has no rhonchi. He has  rales.  Rales and rhonchi present in left lower lung field  Musculoskeletal: Normal range of motion.  Lymphadenopathy:       Head (right side): No submental, no submandibular and no tonsillar adenopathy present.       Head (left side): No submental, no submandibular and no tonsillar adenopathy present.    He has no cervical adenopathy.  Neurological: He is alert and oriented to person, place, and time.  Skin: Skin is warm, dry and intact. No lesion and no rash noted.  Psychiatric:  His speech is normal and behavior is normal. Thought content normal.  Pt's mood seemed very irritable.    BP 130/82 mmHg  Pulse 65  Temp(Src) 98.8 F (37.1 C) (Oral)  Resp 19  Ht 5' 9.25" (1.759 m)  Wt 174 lb (78.926 kg)  BMI 25.51 kg/m2  SpO2 96%  Assessment and Plan:  1. Pneumonia, organism unspecified Will treat for presumptive pneumonia d/t rales in left lower lung base with Zpak. Cough syrup for sleep. Return if not getting better in 7-10 days. - chlorpheniramine-HYDROcodone (TUSSIONEX PENNKINETIC ER) 10-8 MG/5ML SUER; Take 5 mLs by mouth every 12 (twelve) hours as needed for cough.  Dispense: 100 mL; Refill: 0 - azithromycin (ZITHROMAX) 250 MG tablet; Take 2 tabs PO x 1 dose, then 1 tab PO QD x 4 days  Dispense: 6 tablet; Refill: 0   Benjaman Pott. Drenda Freeze, MHS [facility]  01/02/2015

## 2015-01-02 NOTE — Patient Instructions (Signed)
Use cough syrup at night for sleep. Do not drive or operate heavy machinery while on this medication. Continue to drink plenty of water. Take antibiotic as directed. If you are not getting better in 7-10 days, return for follow up.

## 2015-01-10 ENCOUNTER — Encounter: Payer: Medicare Other | Admitting: Family Medicine

## 2015-02-24 DIAGNOSIS — C61 Malignant neoplasm of prostate: Secondary | ICD-10-CM | POA: Diagnosis not present

## 2015-02-24 DIAGNOSIS — N200 Calculus of kidney: Secondary | ICD-10-CM | POA: Diagnosis not present

## 2016-03-30 DIAGNOSIS — C61 Malignant neoplasm of prostate: Secondary | ICD-10-CM | POA: Diagnosis not present

## 2016-03-30 DIAGNOSIS — N200 Calculus of kidney: Secondary | ICD-10-CM | POA: Diagnosis not present

## 2016-04-12 ENCOUNTER — Encounter: Payer: Self-pay | Admitting: Family Medicine

## 2016-04-12 ENCOUNTER — Ambulatory Visit (INDEPENDENT_AMBULATORY_CARE_PROVIDER_SITE_OTHER): Payer: Medicare Other | Admitting: Family Medicine

## 2016-04-12 VITALS — BP 136/86 | HR 57 | Temp 98.4°F | Resp 16 | Ht 70.0 in | Wt 183.2 lb

## 2016-04-12 DIAGNOSIS — Z13220 Encounter for screening for lipoid disorders: Secondary | ICD-10-CM

## 2016-04-12 DIAGNOSIS — Z125 Encounter for screening for malignant neoplasm of prostate: Secondary | ICD-10-CM | POA: Diagnosis not present

## 2016-04-12 DIAGNOSIS — Z0000 Encounter for general adult medical examination without abnormal findings: Secondary | ICD-10-CM

## 2016-04-12 DIAGNOSIS — E785 Hyperlipidemia, unspecified: Secondary | ICD-10-CM

## 2016-04-12 DIAGNOSIS — N400 Enlarged prostate without lower urinary tract symptoms: Secondary | ICD-10-CM | POA: Diagnosis not present

## 2016-04-12 LAB — COMPLETE METABOLIC PANEL WITH GFR
ALT: 24 U/L (ref 9–46)
AST: 21 U/L (ref 10–35)
Albumin: 4.3 g/dL (ref 3.6–5.1)
Alkaline Phosphatase: 83 U/L (ref 40–115)
BUN: 18 mg/dL (ref 7–25)
CO2: 27 mmol/L (ref 20–31)
Calcium: 9.4 mg/dL (ref 8.6–10.3)
Chloride: 106 mmol/L (ref 98–110)
Creat: 1.08 mg/dL (ref 0.70–1.25)
GFR, EST NON AFRICAN AMERICAN: 70 mL/min (ref >=60)
GFR, Est African American: 81 mL/min (ref >=60)
GLUCOSE: 117 mg/dL — AB (ref 65–99)
POTASSIUM: 4.8 mmol/L (ref 3.5–5.3)
SODIUM: 140 mmol/L (ref 135–146)
TOTAL PROTEIN: 6.5 g/dL (ref 6.1–8.1)
Total Bilirubin: 0.7 mg/dL (ref 0.2–1.2)

## 2016-04-12 LAB — LIPID PANEL
CHOL/HDL RATIO: 4.6 ratio (ref <=5.0)
Cholesterol: 172 mg/dL (ref 125–200)
HDL: 37 mg/dL — AB (ref >=40)
LDL Cholesterol: 117 mg/dL (ref <130)
Triglycerides: 92 mg/dL (ref <150)
VLDL: 18 mg/dL (ref <30)

## 2016-04-12 LAB — PSA: PSA: 4.4 ng/mL — AB (ref <=4.0)

## 2016-04-12 NOTE — Patient Instructions (Addendum)
I do recommend repeat colonoscopy, or at least discussing this option with the gastroenterologist. If you change your mind about that, let me know and I'll be happy to refer you.  I also recommend a flu shot, pneumonia vaccine, and shingles vaccines. If you change your mind about any of these let me know. The shingles vaccine would be done at your pharmacy.  Cholesterol borderline elevated in the past. We will recheck levels and determine if medication is recommended, but I do not think that'll be the case at this time.  Keeping you healthy  Get these tests  Blood pressure- Have your blood pressure checked once a year by your healthcare provider.  Normal blood pressure is 120/80  Weight- Have your body mass index (BMI) calculated to screen for obesity.  BMI is a measure of body fat based on height and weight. You can also calculate your own BMI at [email or web address].  Cholesterol- Have your cholesterol checked every year.  Diabetes- Have your blood sugar checked regularly if you have high blood pressure, high cholesterol, have a family history of diabetes or if you are overweight.  Screening for Colon Cancer- Colonoscopy starting at age 69.  Screening may begin sooner depending on your family history and other health conditions. Follow up colonoscopy as directed by your Gastroenterologist.  Screening for Prostate Cancer- Both blood work (PSA) and a rectal exam help screen for Prostate Cancer.  Screening begins at age 38 with African-American men and at age 68 with Caucasian men.  Screening may begin sooner depending on your family history.  Take these medicines  Aspirin- One aspirin daily can help prevent Heart disease and Stroke.  Flu shot- Every fall.  Tetanus- Every 10 years.  Zostavax- Once after the age of 58 to prevent Shingles.  Pneumonia shot- Once after the age of 69; if you are younger than 27, ask your healthcare provider if you need a Pneumonia shot.  Take these  steps  Don't smoke- If you do smoke, talk to your doctor about quitting.  For tips on how to quit, go to www.smokefree.gov or call 1-800-QUIT-NOW.  Be physically active- Exercise 5 days a week for at least 30 minutes.  If you are not already physically active start slow and gradually work up to 30 minutes of moderate physical activity.  Examples of moderate activity include walking briskly, mowing the yard, dancing, swimming, bicycling, etc.  Eat a healthy diet- Eat a variety of healthy food such as fruits, vegetables, low fat milk, low fat cheese, yogurt, lean meant, poultry, fish, beans, tofu, etc. For more information go to www.thenutritionsource.org  Drink alcohol in moderation- Limit alcohol intake to less than two drinks a day. Never drink and drive.  Dentist- Brush and floss twice daily; visit your dentist twice a year.  Depression- Your emotional health is as important as your physical health. If you're feeling down, or losing interest in things you would normally enjoy please talk to your healthcare provider.  Eye exam- Visit your eye doctor every year.  Safe sex- If you may be exposed to a sexually transmitted infection, use a condom.  Seat belts- Seat belts can save your life; always wear one.  Smoke/Carbon Monoxide detectors- These detectors need to be installed on the appropriate level of your home.  Replace batteries at least once a year.  Skin cancer- When out in the sun, cover up and use sunscreen 15 SPF or higher.  Violence- If anyone is threatening you, please tell your healthcare provider.  Living Will/ Health care power of attorney- Speak with your healthcare provider and family.   IF you received an x-ray today, you will receive an invoice from [facility]. Please contact [facility] at [phone] with questions or concerns regarding your invoice.   IF you received labwork today, you will receive an invoice from [employer]. Please contact [facility] at [phone] with questions or concerns regarding your invoice.   Our billing staff will not be able to assist you with questions regarding bills from these companies.  You will be contacted with the lab results as soon as they are available. The fastest way to get your results is to activate your My Chart account. Instructions are located on the last page of this paperwork. If you have not heard from [place] regarding the results in 2 weeks, please contact this office.

## 2016-04-12 NOTE — Progress Notes (Signed)
Subjective:  By signing my name below, I, Gary Roberts, attest that this documentation has been prepared under the direction and in the presence of Gary Ray, MD. Electronically Signed: Moises Roberts, [place]. 04/12/2016 , 10:23 AM .  Patient was seen in Room 1 .   Patient ID: [name], male    DOB: Mar 22, 1946, 70 y.o.   MRN: [id number] Chief Complaint  Patient presents with  . Annual Exam    pt declined flu shot   HPI Gary Roberts is a 70 y.o. male Here for annual physical. New patient to me, previously followed by Dr. Leward Roberts. Last annual wellness exam was in Feb 2016. He previously served in [employer]. He works at [employer] on [employer].   Heart murmur He has a history of mild valvular heart disease. He denies any chest pain, shortness of breath or lightheadedness.   Cancer Screening Colonoscopy: last done in 2007; based on previous note, he does not want to repeat it since it was normal. Patient states it was done by Gary Roberts, and had 3 polyps removed. He had bowel incontinence prior to colonoscopy, and it stopped after. He was informed incontinence stopped completely based on coincidence. He does not want to repeat it. Treatment plan and care was discussed, but patient declined.   Prostate cancer screening: history of BPH, followed by urologist in the past.  Lab Results  Component Value Date   PSA 5.34 (H) 08/04/2012   His urologist, Dr. Delfino Roberts Roberts, requests PSA Roberts work done for kidney stones and BPH. He had PSA Roberts work done at Dr. Puschinsky's office but wants a comparison done. His last visit was a few weeks ago, with PSA of 4.1. In the past, his PSA have been 4.0 and 6.0. He also has a kidney stone impacted. He denies history of hernia.   Immunizations  There is no immunization history on file for this patient.  He declines immunizations today, including flu vaccine, shingles vaccine and pneumonia vaccine.   Depression Depression  screen [facility] 2/9 04/12/2016 01/02/2015 09/10/2014  Decreased Interest 0 0 0  Down, Depressed, Hopeless 0 0 0  PHQ - 2 Score 0 0 0    Vision  Visual Acuity Screening   Right eye Left eye Both eyes  Without correction: 20/15 20/25 20/15   With correction:      He recently saw his eye doctor a few weeks ago, first time in 6~7 years. He had a little blurriness noticed while watching TV. After series of testing, patient was informed that it was normal.   Falls He denies any falls in the past year.   Dentist He sees his dentist every 3~4 years. He notes that it's spread out because if there's no problems, there's no need.   Exercise He runs 3 miles once a week. He paddles his pedal-powered paddleboat around a lake occasionally.   Functional Status Survey: Is the patient deaf or have difficulty hearing?: No Does the patient have difficulty seeing, even when wearing glasses/contacts?: No Does the patient have difficulty concentrating, remembering, or making decisions?: No Does the patient have difficulty walking or climbing stairs?: No Does the patient have difficulty dressing or bathing?: No Does the patient have difficulty doing errands alone such as visiting a doctor's office or shopping?: No  Advanced Directives <no information> in system.  He has a living will and a will. He declines bringing his documents in because he believes it should be taken care of by legal.  May be amenable to bringing copy to scan.   Pneumonia  He was clinically diagnosed in June 2016 but did not have an xray at that time. Refused pneumonia vaccines.    History of hydronephrosis He has history of bilateral hydronephrosis, acute renal failure, UTI, and leukocytosis in June 2014. Followed by urology for nephrolithiasis.   Lab Results  Component Value Date   CREATININE 0.92 09/10/2014    Lipid screening Lab Results  Component Value Date   CHOL 163 09/10/2014   HDL 35 (L) 09/10/2014   LDLCALC 111 (H)  09/10/2014   TRIG 84 09/10/2014   CHOLHDL 4.7 09/10/2014    Patient Active Problem List   Diagnosis Date Noted  . Mild valvular heart disease 09/10/2014  . Low HDL (under 40) 09/10/2014  . BPH (benign prostatic hyperplasia) 01/02/2013  . Urinary retention 01/02/2013  . Bilateral hydronephrosis 01/01/2013  . Acute renal failure ([place]) 01/01/2013  . Leukocytosis 01/01/2013   Past Medical History:  Diagnosis Date  . BPH (benign prostatic hyperplasia)   . Prostate cancer ([place])   . Renal disorder    kidney stone  . Transient global amnesia    Past Surgical History:  Procedure Laterality Date  . CHOLECYSTECTOMY    . FRACTURE SURGERY Left    [address] from a significant height > leg fracture requiring pins and plating.  Marland Kitchen KIDNEY STONE SURGERY     No Known Allergies Prior to Admission medications   Medication Sig Start Date End Date Taking? Authorizing Provider  aspirin 81 MG tablet Take 81 mg by mouth daily.   Yes Historical Provider, MD  vitamin B-12 (CYANOCOBALAMIN) 1000 MCG tablet Take 1,000 mcg by mouth daily.   Yes Historical Provider, MD   Social History   Social History  . Marital status: Married    Spouse name: N/A  . Number of children: 3  . Years of education: N/A   Occupational History  . Food Service    Social History Main Topics  . Smoking status: Never Smoker  . Smokeless tobacco: Not on file  . Alcohol use No  . Drug use: No  . Sexual activity: Yes    Partners: Female   Other Topics Concern  . Not on file   Social History Narrative  . No narrative on file   Review of Systems 13 point ROS - negative     Objective:   Physical Exam  Constitutional: He is oriented to person, place, and time. He appears well-developed and well-nourished.  HENT:  Head: Normocephalic and atraumatic.  Right Ear: External ear normal.  Left Ear: External ear normal.  Mouth/Throat: Oropharynx is clear and moist.  Eyes: Conjunctivae and EOM are normal. Pupils are equal,  round, and reactive to light.  Neck: Normal range of motion. Neck supple. No thyromegaly present.  Cardiovascular: Normal rate, regular rhythm and intact distal pulses.   Murmur heard.  Systolic murmur is present with a grade of 1/6  Grade 1~2 systolic murmur  Pulmonary/Chest: Effort normal and breath sounds normal. No respiratory distress. He has no wheezes.  Abdominal: Soft. He exhibits no distension. There is no tenderness.  Genitourinary:  Genitourinary Comments: Exam deferred as followed by urology.   Musculoskeletal: Normal range of motion. He exhibits no edema or tenderness.  Lymphadenopathy:    He has no cervical adenopathy.  Neurological: He is alert and oriented to person, place, and time. He has normal reflexes.  Skin: Skin is warm and dry.  Psychiatric: He has a  normal mood and affect. His behavior is normal.  Vitals reviewed.    Vitals:   04/12/16 0930  BP: (!) 144/90  Pulse: (!) 57  Resp: 16  Temp: 98.4 F (36.9 C)  TempSrc: Oral  SpO2: 97%  Weight: 183 lb 3.2 oz (83.1 kg)  Height: 5\' 10"  (1.778 m)      Assessment & Plan:   Gary Roberts is a 70 y.o. male Annual physical exam  --anticipatory guidance as below in AVS, screening labs above. Health maintenance items as above in HPI discussed/recommended as applicable.   -advised to let me know if he changes his mind about colonoscopy or immunizations.   BPH (benign prostatic hyperplasia) - Plan: PSA  -continue follow up with urology.   Screening for prostate cancer - Plan: PSA, then result can be sent to urologist in [facility].   Hyperlipidemia - Plan: COMPLETE METABOLIC PANEL WITH GFR, Lipid panel  - borderline prior. Repeat testing.   No orders of the defined types were placed in this encounter.  Patient Instructions    I do recommend repeat colonoscopy, or at least discussing this option with the gastroenterologist. If you change your mind about that, let me know and I'll be happy to refer  you.  I also recommend a flu shot, pneumonia vaccine, and shingles vaccines. If you change your mind about any of these let me know. The shingles vaccine would be done at your pharmacy.  Cholesterol borderline elevated in the past. We will recheck levels and determine if medication is recommended, but I do not think that'll be the case at this time.  Keeping you healthy  Get these tests  Roberts pressure- Have your Roberts pressure checked once a year by your healthcare provider.  Normal Roberts pressure is 120/80  Weight- Have your body mass index (BMI) calculated to screen for obesity.  BMI is a measure of body fat based on height and weight. You can also calculate your own BMI at ViewBanking.si.  Cholesterol- Have your cholesterol checked every year.  Diabetes- Have your Roberts sugar checked regularly if you have high Roberts pressure, high cholesterol, have a family history of diabetes or if you are overweight.  Screening for Colon Cancer- Colonoscopy starting at age 19.  Screening may begin sooner depending on your family history and other health conditions. Follow up colonoscopy as directed by your Gastroenterologist.  Screening for Prostate Cancer- Both Roberts work (PSA) and a rectal exam help screen for Prostate Cancer.  Screening begins at age 43 with African-American men and at age 2 with Caucasian men.  Screening may begin sooner depending on your family history.  Take these medicines  Aspirin- One aspirin daily can help prevent Heart disease and Stroke.  Flu shot- Every fall.  Tetanus- Every 10 years.  Zostavax- Once after the age of 49 to prevent Shingles.  Pneumonia shot- Once after the age of 34; if you are younger than 74, ask your healthcare provider if you need a Pneumonia shot.  Take these steps  Don't smoke- If you do smoke, talk to your doctor about quitting.  For tips on how to quit, go to www.smokefree.gov or call 1-800-QUIT-NOW.  Be physically active-  Exercise 5 days a week for at least 30 minutes.  If you are not already physically active start slow and gradually work up to 30 minutes of moderate physical activity.  Examples of moderate activity include walking briskly, mowing the yard, dancing, swimming, bicycling, etc.  Eat a healthy  diet- Eat a variety of healthy food such as fruits, vegetables, low fat milk, low fat cheese, yogurt, lean meant, poultry, fish, beans, tofu, etc. For more information go to www.thenutritionsource.org  Drink alcohol in moderation- Limit alcohol intake to less than two drinks a day. Never drink and drive.  Dentist- Brush and floss twice daily; visit your dentist twice a year.  Depression- Your emotional health is as important as your physical health. If you're feeling down, or losing interest in things you would normally enjoy please talk to your healthcare provider.  Eye exam- Visit your eye doctor every year.  Safe sex- If you may be exposed to a sexually transmitted infection, use a condom.  Seat belts- Seat belts can save your life; always wear one.  Smoke/Carbon Monoxide detectors- These detectors need to be installed on the appropriate level of your home.  Replace batteries at least once a year.  Skin cancer- When out in the sun, cover up and use sunscreen 15 SPF or higher.  Violence- If anyone is threatening you, please tell your healthcare provider.  Living Will/ Health care power of attorney- Speak with your healthcare provider and family.   IF you received an x-Roberts today, you will receive an invoice from [facility]. Please contact [facility] at [phone] with questions or concerns regarding your invoice.   IF you received labwork today, you will receive an invoice from [employer]. Please contact [facility] at [phone] with questions or concerns regarding your invoice.   Our billing staff will not be able to assist you with questions regarding  bills from these companies.  You will be contacted with the lab results as soon as they are available. The fastest way to get your results is to activate your My Chart account. Instructions are located on the last page of this paperwork. If you have not heard from [place] regarding the results in 2 weeks, please contact this office.       I personally performed the services described in this documentation, which was scribed in my presence. The recorded information has been reviewed and considered, and addended by me as needed.   Signed,   Gary Ray, MD [facility].  04/13/16 9:33 AM

## 2016-04-23 ENCOUNTER — Other Ambulatory Visit: Payer: Self-pay | Admitting: *Deleted

## 2016-04-23 DIAGNOSIS — R7309 Other abnormal glucose: Secondary | ICD-10-CM

## 2017-05-28 ENCOUNTER — Encounter: Payer: Self-pay | Admitting: Family Medicine

## 2017-05-28 ENCOUNTER — Ambulatory Visit (INDEPENDENT_AMBULATORY_CARE_PROVIDER_SITE_OTHER): Payer: Medicare Other | Admitting: Family Medicine

## 2017-05-28 VITALS — BP 130/82 | HR 53 | Temp 98.4°F | Resp 16 | Ht 69.29 in | Wt 187.0 lb

## 2017-05-28 DIAGNOSIS — E785 Hyperlipidemia, unspecified: Secondary | ICD-10-CM | POA: Diagnosis not present

## 2017-05-28 DIAGNOSIS — E786 Lipoprotein deficiency: Secondary | ICD-10-CM | POA: Diagnosis not present

## 2017-05-28 DIAGNOSIS — R972 Elevated prostate specific antigen [PSA]: Secondary | ICD-10-CM | POA: Diagnosis not present

## 2017-05-28 DIAGNOSIS — R739 Hyperglycemia, unspecified: Secondary | ICD-10-CM

## 2017-05-28 DIAGNOSIS — N400 Enlarged prostate without lower urinary tract symptoms: Secondary | ICD-10-CM | POA: Diagnosis not present

## 2017-05-28 DIAGNOSIS — Z0000 Encounter for general adult medical examination without abnormal findings: Secondary | ICD-10-CM | POA: Diagnosis not present

## 2017-05-28 DIAGNOSIS — Z1211 Encounter for screening for malignant neoplasm of colon: Secondary | ICD-10-CM

## 2017-05-28 NOTE — Progress Notes (Signed)
Subjective:  By signing my name below, I, [name], attest that this documentation has been prepared under the direction and in the presence of [name], MD Electronically Signed: Ladene Artist, ED Scribe 05/28/2017 at 2:13 PM.   Patient ID: Gary Roberts., male    DOB: 1945/12/10, 71 y.o.   MRN: [id number]  Chief Complaint  Patient presents with  . Annual Exam   HPI  Gary Roberts. is a 71 y.o. male who presents to Primary Care at [facility] for an annual wellness exam. Last seen 03/2016. H/o kidney stones, BPH followed by Dr. Harlow Asa, heart murmur, aortic stenosis and regurgitation, mitral regurgitation. No current prescription medicines. Denies HA, light-headedness, dizziness, cp, sob, chest tightness.   CA Screening Colonoscopy: 2007. Discussed repeat last year which was refused. Apparently had some bowel incontinence around the time of last colonoscopy. At this visit, pt states that he would like to schedule a colonoscopy due to some bowel incontinence. States anal leakage was occurring regularly, almost weekly, which stopped after colonoscopy. Has noticed symptom has returned, particularly around lunch time. Denies constipation.  Prostate CA Screening: followed by urology for BPH; last seen over a year ago. Pt now only sees urology once a year but requests PSA to be sent over. Agrees to DRE at this visit as well.  Lab Results  Component Value Date   PSA 4.4 (H) 04/12/2016   PSA 5.34 (H) 08/04/2012  Apparently he had a level of 4.1 prior to his last CPE in September. Has had hydronephrosis, nephrolithiasis with ARF, UTI in June 2014.  Lab Results  Component Value Date   CREATININE 1.08 04/12/2016  Skin CA Screening: had a few areas removed by dermatology in the past that were benign   Immunizations  There is no immunization history on file for this patient. Declined flu, shingles, pneumonia vaccines last visit.   Fall No falls within the last  year.  Depression Screening Depression screen [facility] 2/9 05/28/2017 04/12/2016 01/02/2015 09/10/2014  Decreased Interest 0 0 0 0  Down, Depressed, Hopeless 0 0 0 0  PHQ - 2 Score 0 0 0 0   Functional Status Survey: Is the patient deaf or have difficulty hearing?: No Does the patient have difficulty seeing, even when wearing glasses/contacts?: No Does the patient have difficulty concentrating, remembering, or making decisions?: No Does the patient have difficulty walking or climbing stairs?: No Does the patient have difficulty dressing or bathing?: No Does the patient have difficulty doing errands alone such as visiting a doctor's office or shopping?: No   Visual Acuity Screening   Right eye Left eye Both eyes  Without correction: 20/20 20/20 20/20   With correction:      Vision: seen around April 2017 for "haze" Dentist: last seen in 2017 Exercise: exercises daily   Advanced Directives Does have a living will. Declined bringing a copy here.   Hyperglycemia Glucose 117 in 03/2016. Option of A1C put in as future order. He has not had this performed. Denies abdominal pain.   Patient Active Problem List   Diagnosis Date Noted  . Mild valvular heart disease 09/10/2014  . Low HDL (under 40) 09/10/2014  . BPH (benign prostatic hyperplasia) 01/02/2013  . Urinary retention 01/02/2013  . Bilateral hydronephrosis 01/01/2013  . Acute renal failure ([place]) 01/01/2013  . Leukocytosis 01/01/2013   Past Medical History:  Diagnosis Date  . BPH (benign prostatic hyperplasia)   . Prostate cancer ([place])   . Renal disorder  kidney stone  . Transient global amnesia    Past Surgical History:  Procedure Laterality Date  . CHOLECYSTECTOMY    . FRACTURE SURGERY Left    [address] from a significant height > leg fracture requiring pins and plating.  [facility] KIDNEY STONE SURGERY     No Known Allergies Prior to Admission medications   Medication Sig Start Date End Date Taking? Authorizing Provider  aspirin  81 MG tablet Take 81 mg by mouth daily.    [provider]  vitamin B-12 (CYANOCOBALAMIN) 1000 MCG tablet Take 1,000 mcg by mouth daily.    [provider]   Social History   Social History  . Marital status: Married    Spouse name: N/A  . Number of children: 3  . Years of education: N/A   Occupational History  . Food Service    Social History Main Topics  . Smoking status: Never Smoker  . Smokeless tobacco: Never Used  . Alcohol use No  . Drug use: No  . Sexual activity: Yes    Partners: Female   Other Topics Concern  . Not on file   Social History Narrative  . No narrative on file   Review of Systems  Respiratory: Negative for chest tightness and shortness of breath.   Cardiovascular: Negative for chest pain.  Gastrointestinal: Negative for constipation.  Neurological: Negative for dizziness, light-headedness and headaches.  All other systems reviewed and are negative. 13 point ROS was negative.    Objective:   Physical Exam  Constitutional: He is oriented to person, place, and time. He appears well-developed and well-nourished.  HENT:  Head: Normocephalic and atraumatic.  Right Ear: External ear normal.  Left Ear: External ear normal.  Mouth/Throat: Oropharynx is clear and moist.  Eyes: Pupils are equal, round, and reactive to light. Conjunctivae and EOM are normal.  Neck: Normal range of motion. Neck supple. No thyromegaly present.  Cardiovascular: Normal rate, regular rhythm and intact distal pulses.   Murmur heard.  Systolic (2 to 3) murmur is present  Pulmonary/Chest: Effort normal and breath sounds normal. No respiratory distress. He has no wheezes.  Abdominal: Soft. He exhibits no distension. There is no tenderness. Hernia confirmed negative in the right inguinal area and confirmed negative in the left inguinal area.  Genitourinary: Prostate normal.  Musculoskeletal: Normal range of motion. He exhibits no edema or tenderness.   Lymphadenopathy:    He has no cervical adenopathy.  Neurological: He is alert and oriented to person, place, and time. He has normal reflexes.  Skin: Skin is warm and dry.  Psychiatric: He has a normal mood and affect. His behavior is normal.  Vitals reviewed.  Vitals:   05/28/17 1352  BP: 130/82  Pulse: (!) 53  Resp: 16  Temp: 98.4 F (36.9 C)  TempSrc: Oral  SpO2: 98%  Weight: 187 lb (84.8 kg)  Height: 5' 9.29" (1.76 m)      Assessment & Plan:  Gary Roberts. is a 71 y.o. male Medicare annual wellness visit, subsequent  - anticipatory guidance as below in AVS, screening labs if needed. Health maintenance items as above in HPI discussed/recommended as applicable.   - no concerning responses on depression, fall, or functional status screening. Any positive responses noted as above. Advanced directives discussed as in CHL.   - History of heart murmur,aortic stenosis and regurgitation, mitral regurgitation. Asymptomatic. Offered cardiology follow-up for repeat echocardiogram and monitoring, declined at present.  Screen for colon cancer - Plan: Ambulatory referral  to Gastroenterology  - Agrees to colonoscopy at this time. Primarily he wants to have any polyps removed if present, but discussed importance of colonoscopy from cancer screening standpoint. Can discuss specific concerns with gastroenterologist.   - Does report intermittent anal leakage in the past, rare recently. No concerning findings on exam, normal rectal tone. Also advised to discuss these symptoms with gastroenterology especially if persistent.   Hyperglycemia - Plan: Hemoglobin A1c, Comprehensive metabolic panel  - hyperglycemia - check A1c for diabetes/prediabetes screen.   Benign prostatic hyperplasia, unspecified whether lower urinary tract symptoms present Elevated PSA - Plan: PSA  - Agreed to check PSA, but advised that any decisions regarding monitoring, or workup of elevated PSA would need to be discussed  with urology. He agrees with plan  Hyperlipidemia, unspecified hyperlipidemia type - Plan: Lipid panel Low HDL (under 40)  -Mildly elevated LDL at 117 last year, low HDL at 37. Repeat testing, no new medications at this time.  No orders of the defined types were placed in this encounter. ,  Patient Instructions    I will check a PSA, but would like you to discuss those results and plan with your urologist to discuss next step/plans.   I will refer you to gastroenterology for colonoscopy.   Follow-up with ophthalmology/eye care provider at least once every 1-2 years  Would recommend following up with cardiology at some point regarding the heart murmur and valve abnormalities. Let me know if I can place a referral.  Blood sugar was elevated last year, will recheck that today with a 3 month average.  Keeping you healthy  Get these tests  Blood pressure- Have your blood pressure checked once a year by your healthcare provider.  Normal blood pressure is 120/80  Weight- Have your body mass index (BMI) calculated to screen for obesity.  BMI is a measure of body fat based on height and weight. You can also calculate your own BMI at ViewBanking.[email or web address].  Cholesterol- Have your cholesterol checked every year.  Diabetes- Have your blood sugar checked regularly if you have high blood pressure, high cholesterol, have a family history of diabetes or if you are overweight.  Screening for Colon Cancer- Colonoscopy starting at age 11.  Screening may begin sooner depending on your family history and other health conditions. Follow up colonoscopy as directed by your Gastroenterologist.  Screening for Prostate Cancer- Both blood work (PSA) and a rectal exam help screen for Prostate Cancer.  Screening begins at age 75 with African-American men and at age 61 with Caucasian men.  Screening may begin sooner depending on your family history.  Take these medicines  Aspirin- One aspirin daily can  help prevent Heart disease and Stroke.  Flu shot- Every fall.  Tetanus- Every 10 years.  Zostavax- Once after the age of 54 to prevent Shingles.  Pneumonia shot- Once after the age of 44; if you are younger than 49, ask your healthcare provider if you need a Pneumonia shot.  Take these steps  Don't smoke- If you do smoke, talk to your doctor about quitting.  For tips on how to quit, go to www.smokefree.gov or call 1-800-QUIT-NOW.  Be physically active- Exercise 5 days a week for at least 30 minutes.  If you are not already physically active start slow and gradually work up to 30 minutes of moderate physical activity.  Examples of moderate activity include walking briskly, mowing the yard, dancing, swimming, bicycling, etc.  Eat a healthy diet- Eat a variety of healthy  food such as fruits, vegetables, low fat milk, low fat cheese, yogurt, lean meant, poultry, fish, beans, tofu, etc. For more information go to www.thenutritionsource.org  Drink alcohol in moderation- Limit alcohol intake to less than two drinks a day. Never drink and drive.  Dentist- Brush and floss twice daily; visit your dentist twice a year.  Depression- Your emotional health is as important as your physical health. If you're feeling down, or losing interest in things you would normally enjoy please talk to your healthcare provider.  Eye exam- Visit your eye doctor every year.  Safe sex- If you may be exposed to a sexually transmitted infection, use a condom.  Seat belts- Seat belts can save your life; always wear one.  Smoke/Carbon Monoxide detectors- These detectors need to be installed on the appropriate level of your home.  Replace batteries at least once a year.  Skin cancer- When out in the sun, cover up and use sunscreen 15 SPF or higher.  Violence- If anyone is threatening you, please tell your healthcare provider.  Living Will/ Health care power of attorney- Speak with your healthcare provider and  family.      IF you received an x-ray today, you will receive an invoice from [facility]. Please contact [facility] at [phone] with questions or concerns regarding your invoice.   IF you received labwork today, you will receive an invoice from [place] [facility]. Please contact [facility] at [phone] with questions or concerns regarding your invoice.   Our billing staff will not be able to assist you with questions regarding bills from these companies.  You will be contacted with the lab results as soon as they are available. The fastest way to get your results is to activate your My Chart account. Instructions are located on the last page of this paperwork. If you have not heard from [place] regarding the results in 2 weeks, please contact this office.       I personally performed the services described in this documentation, which was scribed in my presence. The recorded information has been reviewed and considered for accuracy and completeness, addended by me as needed, and agree with information above.  Signed,   Merri Ray, MD [facility] at [place].  05/30/17 2:31 PM

## 2017-05-28 NOTE — Patient Instructions (Addendum)
I will check a PSA, but would like you to discuss those results and plan with your urologist to discuss next step/plans.   I will refer you to gastroenterology for colonoscopy.   Follow-up with ophthalmology/eye care provider at least once every 1-2 years  Would recommend following up with cardiology at some point regarding the heart murmur and valve abnormalities. Let me know if I can place a referral.  Blood sugar was elevated last year, will recheck that today with a 3 month average.  Keeping you healthy  Get these tests  Blood pressure- Have your blood pressure checked once a year by your healthcare provider.  Normal blood pressure is 120/80  Weight- Have your body mass index (BMI) calculated to screen for obesity.  BMI is a measure of body fat based on height and weight. You can also calculate your own BMI at ViewBanking[employer]si.  Cholesterol- Have your cholesterol checked every year.  Diabetes- Have your blood sugar checked regularly if you have high blood pressure, high cholesterol, have a family history of diabetes or if you are overweight.  Screening for Colon Cancer- Colonoscopy starting at age 34.  Screening may begin sooner depending on your family history and other health conditions. Follow up colonoscopy as directed by your Gastroenterologist.  Screening for Prostate Cancer- Both blood work (PSA) and a rectal exam help screen for Prostate Cancer.  Screening begins at age 84 with African-American men and at age 46 with Caucasian men.  Screening may begin sooner depending on your family history.  Take these medicines  Aspirin- One aspirin daily can help prevent Heart disease and Stroke.  Flu shot- Every fall.  Tetanus- Every 10 years.  Zostavax- Once after the age of 38 to prevent Shingles.  Pneumonia shot- Once after the age of 10; if you are younger than 82, ask your healthcare provider if you need a Pneumonia shot.  Take these steps  Don't smoke- If you  do smoke, talk to your doctor about quitting.  For tips on how to quit, go to www.smokefree.gov or call 1-800-QUIT-NOW.  Be physically active- Exercise 5 days a week for at least 30 minutes.  If you are not already physically active start slow and gradually work up to 30 minutes of moderate physical activity.  Examples of moderate activity include walking briskly, mowing the yard, dancing, swimming, bicycling, etc.  Eat a healthy diet- Eat a variety of healthy food such as fruits, vegetables, low fat milk, low fat cheese, yogurt, lean meant, poultry, fish, beans, tofu, etc. For more information go to www.thenutritionsource.org  Drink alcohol in moderation- Limit alcohol intake to less than two drinks a day. Never drink and drive.  Dentist- Brush and floss twice daily; visit your dentist twice a year.  Depression- Your emotional health is as important as your physical health. If you're feeling down, or losing interest in things you would normally enjoy please talk to your healthcare provider.  Eye exam- Visit your eye doctor every year.  Safe sex- If you may be exposed to a sexually transmitted infection, use a condom.  Seat belts- Seat belts can save your life; always wear one.  Smoke/Carbon Monoxide detectors- These detectors need to be installed on the appropriate level of your home.  Replace batteries at least once a year.  Skin cancer- When out in the sun, cover up and use sunscreen 15 SPF or higher.  Violence- If anyone is threatening you, please tell your healthcare provider.  Living Will/ Health care power  of attorney- Speak with your healthcare provider and family.      IF you received an x-ray today, you will receive an invoice from [facility]. Please contact [facility] at [phone] with questions or concerns regarding your invoice.   IF you received labwork today, you will receive an invoice from [place]. Please contact [facility] at [phone] with  questions or concerns regarding your invoice.   Our billing staff will not be able to assist you with questions regarding bills from these companies.  You will be contacted with the lab results as soon as they are available. The fastest way to get your results is to activate your My Chart account. Instructions are located on the last page of this paperwork. If you have not heard from [place] regarding the results in 2 weeks, please contact this office.

## 2017-05-29 LAB — COMPREHENSIVE METABOLIC PANEL
ALBUMIN: 4.4 g/dL (ref 3.6–4.8)
ALK PHOS: 94 IU/L (ref 39–117)
ALT: 25 IU/L (ref 0–44)
AST: 24 IU/L (ref 0–40)
Albumin/Globulin Ratio: 2 (ref 1.2–2.2)
BUN / CREAT RATIO: 14 (ref 10–24)
BUN: 14 mg/dL (ref 8–27)
Bilirubin Total: 0.8 mg/dL (ref 0.0–1.2)
CALCIUM: 9.1 mg/dL (ref 8.6–10.2)
CO2: 26 mmol/L (ref 20–29)
CREATININE: 1 mg/dL (ref 0.76–1.27)
Chloride: 104 mmol/L (ref 96–106)
GFR, EST AFRICAN AMERICAN: 88 mL/min/{1.73_m2} (ref >59)
GFR, EST NON AFRICAN AMERICAN: 76 mL/min/{1.73_m2} (ref >59)
GLOBULIN, TOTAL: 2.2 g/dL (ref 1.5–4.5)
Glucose: 93 mg/dL (ref 65–99)
Potassium: 4.4 mmol/L (ref 3.5–5.2)
SODIUM: 142 mmol/L (ref 134–144)
Total Protein: 6.6 g/dL (ref 6.0–8.5)

## 2017-05-29 LAB — LIPID PANEL
CHOL/HDL RATIO: 5.1 ratio — AB (ref 0.0–5.0)
Cholesterol, Total: 179 mg/dL (ref 100–199)
HDL: 35 mg/dL — ABNORMAL LOW (ref >39)
LDL Calculated: 124 mg/dL — ABNORMAL HIGH (ref 0–99)
Triglycerides: 98 mg/dL (ref 0–149)
VLDL Cholesterol Cal: 20 mg/dL (ref 5–40)

## 2017-05-29 LAB — HEMOGLOBIN A1C
Est. average glucose Bld gHb Est-mCnc: 120 mg/dL
Hgb A1c MFr Bld: 5.8 % — ABNORMAL HIGH (ref 4.8–5.6)

## 2017-05-29 LAB — PSA: PROSTATE SPECIFIC AG, SERUM: 5.5 ng/mL — AB (ref 0.0–4.0)

## 2017-06-03 NOTE — Progress Notes (Signed)
Lm to call back for results

## 2017-06-04 ENCOUNTER — Telehealth: Payer: Self-pay | Admitting: Family Medicine

## 2017-06-04 NOTE — Telephone Encounter (Signed)
Copied from [place] #[id number]. Topic: Quick Communication - See Telephone Encounter >> Jun 04, 2017 11:04 AM Bea Graff, NT wrote: CRM for notification. See Telephone encounter for: Patient calling to get his lab results. He states that if he is not able to answer the call to please leave the details on his home answering machine.   06/04/17.

## 2017-06-06 ENCOUNTER — Ambulatory Visit: Payer: Self-pay | Admitting: *Deleted

## 2017-06-06 NOTE — Telephone Encounter (Signed)
PT called back about lab results, he says it is ok to leave message on his answering machine

## 2017-06-07 ENCOUNTER — Telehealth: Payer: Self-pay

## 2017-06-07 NOTE — Telephone Encounter (Signed)
Lm for pt to cb re : lab results °

## 2017-06-13 ENCOUNTER — Telehealth: Payer: Self-pay

## 2017-06-13 NOTE — Telephone Encounter (Signed)
-----   Message from [name], MD sent at 06/02/2017  2:12 PM EST ----- Call patient.   Hemoglobin A1c was at prediabetes level again. Watch diet, exercise most days per week and repeat testing in next 3 months. No new medication needed at this time. Kidney, liver tests, electrolytes were normal. Cholesterol was also mildly elevated with LDL 124, HDL or good cholesterol was slightly low at 35. These are not quite as good as last year. Diet changes, exercising most days per week may help, but can hold on medication changes for now. Recommend repeat testing in the next 3-6 months. PSA was elevated at 5.5. We can send a copy of this to his urologist if needed to discuss next step, but as we discussed in office I would like his urologist to make any decisions regarding that level. Let me know if there are questions.

## 2017-06-13 NOTE — Telephone Encounter (Signed)
Phone call to patient. Relayed message from provider. Patient had further questions regarding diet. Recommended patient reduce amount of "white" foods (potatoes, white rice, white bread, pasta), processed foods, and foods with added sugar. He states that he will "pig out" at dinner and before bed, and most days not eat breakfast or lunch. Had stew for dinner last night, and an omelette the night before that. Encouraged patient to include a fat+fiber+protein at every meal as this helps with overall blood sugar control. He is agreeable, has no further questions.  He verbally requests we send PSA results to urologist Dr. Harlow Asa. Note forwarded to medical records pool to fulfill request.

## 2017-06-18 DIAGNOSIS — Z1211 Encounter for screening for malignant neoplasm of colon: Secondary | ICD-10-CM | POA: Diagnosis not present

## 2017-06-18 DIAGNOSIS — Z1212 Encounter for screening for malignant neoplasm of rectum: Secondary | ICD-10-CM | POA: Diagnosis not present

## 2017-06-21 LAB — COLOGUARD: Cologuard: NEGATIVE

## 2018-01-25 ENCOUNTER — Encounter: Payer: Self-pay | Admitting: Urgent Care

## 2018-01-25 ENCOUNTER — Other Ambulatory Visit: Payer: Self-pay

## 2018-01-25 ENCOUNTER — Ambulatory Visit (INDEPENDENT_AMBULATORY_CARE_PROVIDER_SITE_OTHER): Payer: Medicare Other

## 2018-01-25 ENCOUNTER — Ambulatory Visit (INDEPENDENT_AMBULATORY_CARE_PROVIDER_SITE_OTHER): Payer: Medicare Other | Admitting: Urgent Care

## 2018-01-25 VITALS — BP 120/80 | HR 59 | Temp 98.8°F | Resp 16 | Ht 69.25 in | Wt 185.6 lb

## 2018-01-25 DIAGNOSIS — M79672 Pain in left foot: Secondary | ICD-10-CM

## 2018-01-25 DIAGNOSIS — M7989 Other specified soft tissue disorders: Secondary | ICD-10-CM

## 2018-01-25 DIAGNOSIS — L84 Corns and callosities: Secondary | ICD-10-CM | POA: Diagnosis not present

## 2018-01-25 NOTE — Progress Notes (Signed)
   MRN: [id number] DOB: 1946/04/10  Subjective:   Gary Arey. is a 72 y.o. male presenting for 2 week history of persistent left sided foot pain, worse with walking and pressure. Patient stands for long periods of time, would like to be running but can't because of the pain in his left foot. Reports a remote history of trauma to his left leg.  Denies fever, redness, trauma to his foot, warmth.  He has not tried any medications for pain relief.  Gary Roberts has a current medication list which includes the following prescription(s): aspirin and vitamin b-12. Also has No Known Allergies.  Gary Roberts  has a past medical history of BPH (benign prostatic hyperplasia), Prostate cancer ([place]), Renal disorder, and Transient global amnesia. Also  has a past surgical history that includes Cholecystectomy; Kidney stone surgery; and Fracture surgery (Left).  Objective:   Vitals: BP 120/80 (BP Location: Right Arm)   Pulse (!) 59   Temp 98.8 F (37.1 C) (Oral)   Resp 16   Ht 5' 9.25" (1.759 m)   Wt 185 lb 9.6 oz (84.2 kg)   SpO2 97%   BMI 27.21 kg/m   Physical Exam  Constitutional: He is oriented to person, place, and time. He appears well-developed and well-nourished.  Cardiovascular: Normal rate.  Pulmonary/Chest: Effort normal.  Musculoskeletal:       Feet:  Neurological: He is alert and oriented to person, place, and time.   Dg Foot Complete Left  Result Date: 01/25/2018 CLINICAL DATA:  Lateral left foot pain and swelling. EXAM: LEFT FOOT - COMPLETE 3+ VIEW COMPARISON:  None. FINDINGS: There is no evidence of fracture or dislocation. There is no evidence of arthropathy or other focal bone abnormality. Marked swelling centered at the fifth metatarsophalangeal joint. IMPRESSION: Marked lateral foot swelling at the level of the fifth metatarsophalangeal joint without evidence of osseous abnormalities. Electronically Signed   By: Fidela Salisbury M.D.   On: 01/25/2018 14:41     Assessment and  Plan :   Left foot pain - Plan: DG Foot Complete Left, Ambulatory referral to Podiatry  Swelling of left foot - Plan: DG Foot Complete Left, Ambulatory referral to Podiatry  Plantar callus - Plan: Ambulatory referral to Podiatry  Will pursue referral to podiatry, practice within his network.  He specifically wanted to check in with [facility].   I tried to offer patient medications for pain relief, patient became upset stating that he is not interested in getting dope.  I counseled that I was trying to offer him an NSAID and patient refused anyway.  I let patient know that our referral process can take up to 2 weeks to get started, recommended that he call his insurance provider if he would like to try to get this sooner on his own.  He refused labs.  Follow-up as needed.  Jaynee Eagles, PA-C [facility] [phone] 01/25/2018  2:18 PM

## 2018-01-25 NOTE — Patient Instructions (Addendum)
Corns and Calluses Corns are small areas of thickened skin that occur on the top, sides, or tip of a toe. They contain a cone-shaped core with a point that can press on a nerve below. This causes pain. Calluses are areas of thickened skin that can occur anywhere on the body including hands, fingers, palms, soles of the feet, and heels.Calluses are usually larger than corns. What are the causes? Corns and calluses are caused by rubbing (friction) or pressure, such as from shoes that are too tight or do not fit properly. What increases the risk? Corns are more likely to develop in people who have toe deformities, such as hammer toes. Since calluses can occur with friction to any area of the skin, calluses are more likely to develop in people who:  Work with their hands.  Wear shoes that fit poorly, shoes that are too tight, or shoes that are high-heeled.  Have toes deformities.  What are the signs or symptoms? Symptoms of a corn or callus include:  A hard growth on the skin.  Pain or tenderness under the skin.  Redness and swelling.  Increased discomfort while wearing tight-fitting shoes.  How is this diagnosed? Corns and calluses may be diagnosed with a medical history and physical exam. How is this treated? Corns and calluses may be treated with:  Removing the cause of the friction or pressure. This may include: ? Changing your shoes. ? Wearing shoe inserts (orthotics) or other protective layers in your shoes, such as a corn pad. ? Wearing gloves.  Medicines to help soften skin in the hardened, thickened areas.  Reducing the size of the corn or callus by removing the dead layers of skin.  Antibiotic medicines to treat infection.  Surgery, if a toe deformity is the cause.  Follow these instructions at home:  Take medicines only as directed by your health care provider.  If you were prescribed an antibiotic, finish all of it even if you start to feel better.  Wear  shoes that fit well. Avoid wearing high-heeled shoes and shoes that are too tight or too loose.  Wear any padding, protective layers, gloves, or orthotics as directed by your health care provider.  Soak your hands or feet and then use a file or pumice stone to soften your corn or callus. Do this as directed by your health care provider.  Check your corn or callus every day for signs of infection. Watch for: ? Redness, swelling, or pain. ? Fluid, blood, or pus. Contact a health care provider if:  Your symptoms do not improve with treatment.  You have increased redness, swelling, or pain at the site of your corn or callus.  You have fluid, blood, or pus coming from your corn or callus.  You have new symptoms. This information is not intended to replace advice given to you by your health care provider. Make sure you discuss any questions you have with your health care provider. Document Released: 04/21/2004 Document Revised: 02/03/2016 Document Reviewed: 07/12/2014 Elsevier Interactive Patient Education  2018 [employer].     IF you received an x-ray today, you will receive an invoice from [facility] Radiology. Please contact [facility] at [phone] with questions or concerns regarding your invoice.   IF you received labwork today, you will receive an invoice from [place]. Please contact [facility] at [phone] with questions or concerns regarding your invoice.   Our billing staff will not be able to assist you with questions regarding bills from these companies.  You will be contacted with the lab results as soon as they are available. The fastest way to get your results is to activate your My Chart account. Instructions are located on the last page of this paperwork. If you have not heard from [place] regarding the results in 2 weeks, please contact this office.

## 2018-01-27 DIAGNOSIS — M21622 Bunionette of left foot: Secondary | ICD-10-CM | POA: Diagnosis not present

## 2018-01-27 DIAGNOSIS — M79672 Pain in left foot: Secondary | ICD-10-CM | POA: Diagnosis not present

## 2018-06-03 ENCOUNTER — Other Ambulatory Visit: Payer: Self-pay

## 2018-06-03 ENCOUNTER — Ambulatory Visit (INDEPENDENT_AMBULATORY_CARE_PROVIDER_SITE_OTHER): Payer: Medicare Other | Admitting: Family Medicine

## 2018-06-03 ENCOUNTER — Encounter: Payer: Self-pay | Admitting: Family Medicine

## 2018-06-03 VITALS — BP 132/85 | HR 50 | Temp 98.3°F | Ht 70.0 in | Wt 189.2 lb

## 2018-06-03 DIAGNOSIS — R7303 Prediabetes: Secondary | ICD-10-CM

## 2018-06-03 DIAGNOSIS — E785 Hyperlipidemia, unspecified: Secondary | ICD-10-CM

## 2018-06-03 DIAGNOSIS — Z0000 Encounter for general adult medical examination without abnormal findings: Secondary | ICD-10-CM | POA: Diagnosis not present

## 2018-06-03 DIAGNOSIS — D224 Melanocytic nevi of scalp and neck: Secondary | ICD-10-CM

## 2018-06-03 DIAGNOSIS — N400 Enlarged prostate without lower urinary tract symptoms: Secondary | ICD-10-CM

## 2018-06-03 DIAGNOSIS — R972 Elevated prostate specific antigen [PSA]: Secondary | ICD-10-CM

## 2018-06-03 DIAGNOSIS — D225 Melanocytic nevi of trunk: Secondary | ICD-10-CM

## 2018-06-03 NOTE — Progress Notes (Signed)
Subjective:  By signing my name below, I, Gary Roberts, attest that this documentation has been prepared under the direction and in the presence of Gary Agreste, MD Electronically Signed: Ladene Artist, ED Scribe 06/03/2018 at 10:27 AM.   Patient ID: Gary Roberts., male    DOB: 02/18/1946, 72 y.o.   MRN: [id number]  Chief Complaint  Patient presents with  . Annual Exam    CPE   HPI Gary Roberts. is a 72 y.o. male who presents to Primary Care at [facility] for an annual exam. 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 - Pt defers referral to cardiologist at this time. Denies cp, sob, lightheadedness, dizziness with exercise.  BPH Followed by Dr. Harlow Asa. PSA of 5.5 and DRE at last CPE with me in 04/2017. - Last OV ~3 yrs ago. States Dr. Harlow Asa wanted to follow him annually but pt would rather have his PCP check his PSA and send over the results.  Hyperlipidemia Lab Results  Component Value Date   CHOL 179 05/28/2017   HDL 35 (L) 05/28/2017   LDLCALC 124 (H) 05/28/2017   TRIG 98 05/28/2017   CHOLHDL 5.1 (H) 05/28/2017   Lab Results  Component Value Date   ALT 25 05/28/2017   AST 24 05/28/2017   ALKPHOS 94 05/28/2017   BILITOT 0.8 05/28/2017   The 10-year ASCVD risk score Gary Bussing DC Jr., et al., 2013) is: 20.9%   Values used to calculate the score:     Age: 20 years     Sex: Male     Is Non-Hispanic African American: No     Diabetic: No     Tobacco smoker: No     Systolic Blood Pressure: 676 mmHg     Is BP treated: No     HDL Cholesterol: 35 mg/dL     Total Cholesterol: 179 mg/dL  Recommended recheck in 3-6 months after diet changes and exercise. - Pt reports exercising daily. Denies changes to diet; denies eating fast food or sodas x 15 yrs. He does report drinking unsweetened apple juice. No family h/o high cholesterol.  PreDM Lab Results  Component Value Date   HGBA1C 5.8 (H)  05/28/2017   Wt Readings from Last 3 Encounters:  06/03/18 189 lb 3.2 oz (85.8 kg)  01/25/18 185 lb 9.6 oz (84.2 kg)  05/28/17 187 lb (84.8 kg)  Recommended rpt testing in 3 months when seen in 04/2017. - Mother with a h/o DM.  CA Screening Colonoscopy: discussed in 04/2017, was having some anal leakage at that time which he reported improved after prev colonoscopy. Referred to GI. Note on 06/11/17 from Dr. [name], planned for cologuard, neg on 06/21/17. - Denies recurrence of anal leakage. Prostate CA Screening: followed by urology as above. Most recent was 5.5 in 04/2017, recommended f/u with urology - Pt declines DRE but agrees to PSA today Lab Results  Component Value Date   PSA 4.4 (H) 04/12/2016   PSA 5.34 (H) 08/04/2012  Skin CA Screening: Pt has noticed an itchy, non-tender "wart" to the top of his scalp first noticed 6 months ago. Denies changes to the area or bleeding. Has been seen in the past by [facility] for a "horn" that was growing in his head and had other areas removed 9-10 yrs ago that were benign.  Immunizations  There is no immunization history on file for this patient. Declined flu, shingles and pneumonia vaccines prev. - Also declines  vaccines today.  Fall Screening No falls within the past yr.  Depression Screening Depression screen [facility] 2/9 06/03/2018 01/25/2018 05/28/2017 04/12/2016 01/02/2015  Decreased Interest 0 0 0 0 0  Down, Depressed, Hopeless - 0 0 0 0  PHQ - 2 Score 0 0 0 0 0   Functional Status Survey: Is the patient deaf or have difficulty hearing?: No Does the patient have difficulty seeing, even when wearing glasses/contacts?: No Does the patient have difficulty concentrating, remembering, or making decisions?: No Does the patient have difficulty walking or climbing stairs?: No Does the patient have difficulty dressing or bathing?: No Does the patient have difficulty doing errands alone such as visiting a doctor's office or shopping?:  No  Mental Status Screening 6CIT Screen 06/03/2018  What Year? 0 points  What month? 0 points  What time? 0 points  Count back from 20 0 points  Months in reverse 0 points  Repeat phrase 2 points  Total Score 2     Visual Acuity Screening   Right eye Left eye Both eyes  Without correction: 20/25 20/25 20/25   With correction:      Vision: last seen 3-4 yrs ago for a foreign body Dentist: has not seen a dentist recently Exercise: daily  Advanced Directives  Has a HCPOA and living will.  Patient Active Problem List   Diagnosis Date Noted  . Mild valvular heart disease 09/10/2014  . Low HDL (under 40) 09/10/2014  . BPH (benign prostatic hyperplasia) 01/02/2013  . Urinary retention 01/02/2013  . Bilateral hydronephrosis 01/01/2013  . Acute renal failure ([place]) 01/01/2013  . Leukocytosis 01/01/2013   Past Medical History:  Diagnosis Date  . BPH (benign prostatic hyperplasia)   . Prostate cancer ([place])   . Renal disorder    kidney stone  . Transient global amnesia    Past Surgical History:  Procedure Laterality Date  . CHOLECYSTECTOMY    . FRACTURE SURGERY Left    [address] from a significant height > leg fracture requiring pins and plating.  Marland Kitchen KIDNEY STONE SURGERY     No Known Allergies Prior to Admission medications   Medication Sig Start Date End Date Taking? Authorizing Provider  aspirin 81 MG tablet Take 81 mg by mouth daily.    [provider]  vitamin B-12 (CYANOCOBALAMIN) 1000 MCG tablet Take 1,000 mcg by mouth daily.    [provider]   Social History   Socioeconomic History  . Marital status: Married    Spouse name: Not on file  . Number of children: 3  . Years of education: Not on file  . Highest education level: Not on file  Occupational History  . Occupation: Ambulance person  Social Needs  . Financial resource strain: Not on file  . Food insecurity:    Worry: Not on file    Inability: Not on file  . Transportation needs:     Medical: Not on file    Non-medical: Not on file  Tobacco Use  . Smoking status: Never Smoker  . Smokeless tobacco: Never Used  Substance and Sexual Activity  . Alcohol use: No    Alcohol/week: 0.0 standard drinks  . Drug use: No  . Sexual activity: Yes    Partners: Female  Lifestyle  . Physical activity:    Days per week: Not on file    Minutes per session: Not on file  . Stress: Not on file  Relationships  . Social connections:    Talks on phone: Not on  file    Gets together: Not on file    Attends religious service: Not on file    Active member of club or organization: Not on file    Attends meetings of clubs or organizations: Not on file    Relationship status: Not on file  . Intimate partner violence:    Fear of current or ex partner: Not on file    Emotionally abused: Not on file    Physically abused: Not on file    Forced sexual activity: Not on file  Other Topics Concern  . Not on file  Social History Narrative  . Not on file   Review of Systems  Respiratory: Negative for shortness of breath.   Cardiovascular: Negative for chest pain.  Neurological: Negative for dizziness and light-headedness.     Objective:   Physical Exam  Constitutional: He is oriented to person, place, and time. He appears well-developed and well-nourished.  HENT:  Head: Normocephalic and atraumatic.  Right Ear: External ear normal.  Left Ear: External ear normal.  Mouth/Throat: Oropharynx is clear and moist.  Eyes: Pupils are equal, round, and reactive to light. Conjunctivae and EOM are normal.  Neck: Normal range of motion. Neck supple. Carotid bruit is not present. No thyromegaly present.  Cardiovascular: Normal rate, regular rhythm and intact distal pulses.  Murmur heard.  Systolic murmur is present with a grade of 2/6. Pulmonary/Chest: Effort normal and breath sounds normal. No respiratory distress. He has no wheezes.  Abdominal: Soft. He exhibits no distension. There is no  tenderness. Hernia confirmed negative in the right inguinal area and confirmed negative in the left inguinal area.  Genitourinary: Prostate normal.  Musculoskeletal: Normal range of motion. He exhibits no edema or tenderness.  Lymphadenopathy:    He has no cervical adenopathy.  Neurological: He is alert and oriented to person, place, and time. He has normal reflexes.  Skin: Skin is warm and dry. Lesion noted.  L frontotemporal scalp: slightly raised, scaly appearing lesion ~4 mm across. No surrounding inflammation. No surrounding erythema or discharge. ~5 mm nevus on mid abdominal wall with varying color. Multiple other scattered small nevi on back and trunk.  Psychiatric: He has a normal mood and affect. His behavior is normal.  Vitals reviewed.  Vitals:   06/03/18 0953  BP: 132/85  Pulse: (!) 50  Temp: 98.3 F (36.8 C)  TempSrc: Oral  SpO2: 97%  Weight: 189 lb 3.2 oz (85.8 kg)  Height: 5\' 10"  (1.778 m)      Assessment & Plan:   Tayron Hunnell. is a 72 y.o. male Encounter for Medicare annual wellness exam  - - anticipatory guidance as below in AVS, screening labs if needed. Health maintenance items as above in HPI discussed/recommended as applicable. Immunizations declined.  - no concerning responses on depression, fall, or functional status screening. Any positive responses noted as above. Advanced directives discussed as in CHL.   Prediabetes - Plan: Hemoglobin A1c  -Diet/exercise previously discussed, has not had recent A1c.  Handout given on prediabetes, recommended A1c monitoring every 3 to 6 months.  Repeat testing today.  Hyperlipidemia, unspecified hyperlipidemia type - Plan: Lipid panel, Comprehensive metabolic panel  -Previously elevated, repeat testing today as initial plan of repeat in 6 months when seen last year.  Continue diet exercise approach for now.  Benign prostatic hyperplasia, unspecified whether lower urinary tract symptoms present - Plan: PSA Elevated  PSA - Plan: PSA  -Discussed meeting with urologist, but poorly has had plan  of obtaining PSA been discussed and if any office visit or follow-up needed with urology.  DRE was deferred/declined by patient today, but will check PSA.  Further work-up/monitoring intervals can be decided by urology once we have those readings.  Nevus of scalp - Plan: Ambulatory referral to Dermatology Nevus of abdominal wall - Plan: Ambulatory referral to Dermatology  -Nevus on scalp concerning for possible squamous cell versus seborrheic keratosis. Multiple scattered nevi, one on mid abdomen with some color variation, borderline size.  Refer to dermatology for further evaluation and possible biopsy of scalp lesion  No orders of the defined types were placed in this encounter.  Patient Instructions   I will recheck cholesterol and diabetes/prediabetes test today. Keep up the good work with diet and exercise.   I will check the PSA today, but follow up should be determined and discussed with your urologist.   I do recommend flu vaccine, shingles vaccine and pneumonia vaccines. Let me know if you would like those given.   I placed a referral to dermatology for the area on scalp and abdomen.  Thanks for coming in today.    Prediabetes Prediabetes is the condition of having a blood sugar (blood glucose) level that is higher than it should be, but not high enough for you to be diagnosed with type 2 diabetes. Having prediabetes puts you at risk for developing type 2 diabetes (type 2 diabetes mellitus). Prediabetes may be called impaired glucose tolerance or impaired fasting glucose. Prediabetes usually does not cause symptoms. Your health care provider can diagnose this condition with blood tests. You may be tested for prediabetes if you are overweight and if you have at least one other risk factor for prediabetes. Risk factors for prediabetes include:  Having a family member with type 2 diabetes.  Being overweight  or obese.  Being older than age 97.  Being of American-Indian, African-American, Hispanic/Latino, or Asian/Pacific Islander descent.  Having an inactive (sedentary) lifestyle.  Having a history of gestational diabetes or polycystic ovarian syndrome (PCOS).  Having low levels of good cholesterol (HDL-C) or high levels of blood fats (triglycerides).  Having high blood pressure.  What is blood glucose and how is blood glucose measured?  Blood glucose refers to the amount of glucose in your bloodstream. Glucose comes from eating foods that contain sugars and starches (carbohydrates) that the body breaks down into glucose. Your blood glucose level may be measured in mg/dL (milligrams per deciliter) or mmol/L (millimoles per liter).Your blood glucose may be checked with one or more of the following blood tests:  A fasting blood glucose (FBG) test. You will not be allowed to eat (you will fast) for at least 8 hours before a blood sample is taken. ? A normal range for FBG is 70-100 mg/dl (3.9-5.6 mmol/L).  An A1c (hemoglobin A1c) blood test. This test provides information about blood glucose control over the previous 2?41months.  An oral glucose tolerance test (OGTT). This test measures your blood glucose twice: ? After fasting. This is your baseline level. ? Two hours after you drink a beverage that contains glucose.  You may be diagnosed with prediabetes:  If your FBG is 100?125 mg/dL (5.6-6.9 mmol/L).  If your A1c level is 5.7?6.4%.  If your OGGT result is 140?199 mg/dL (7.8-11 mmol/L).  These blood tests may be repeated to confirm your diagnosis. What happens if blood glucose is too high? The pancreas produces a hormone (insulin) that helps move glucose from the bloodstream into cells. When cells  in the body do not respond properly to insulin that the body makes (insulin resistance), excess glucose builds up in the blood instead of going into cells. As a result, high blood glucose  (hyperglycemia) can develop, which can cause many complications. This is a symptom of prediabetes. What can happen if blood glucose stays higher than normal for a long time? Having high blood glucose for a long time is dangerous. Too much glucose in your blood can damage your nerves and blood vessels. Long-term damage can lead to complications from diabetes, which may include:  Heart disease.  Stroke.  Blindness.  Kidney disease.  Depression.  Poor circulation in the feet and legs, which could lead to surgical removal (amputation) in severe cases.  How can prediabetes be prevented from turning into type 2 diabetes?  To help prevent type 2 diabetes, take the following actions:  Be physically active. ? Do moderate-intensity physical activity for at least 30 minutes on at least 5 days of the week, or as much as told by your health care provider. This could be brisk walking, biking, or water aerobics. ? Ask your health care provider what activities are safe for you. A mix of physical activities may be best, such as walking, swimming, cycling, and strength training.  Lose weight as told by your health care provider. ? Losing 5-7% of your body weight can reverse insulin resistance. ? Your health care provider can determine how much weight loss is best for you and can help you lose weight safely.  Follow a healthy meal plan. This includes eating lean proteins, complex carbohydrates, fresh fruits and vegetables, low-fat dairy products, and healthy fats. ? Follow instructions from your health care provider about eating or drinking restrictions. ? Make an appointment to see a diet and nutrition specialist (registered dietitian) to help you create a healthy eating plan that is right for you.  Do not smoke or use any tobacco products, such as cigarettes, chewing tobacco, and e-cigarettes. If you need help quitting, ask your health care provider.  Take over-the-counter and prescription medicines  as told by your health care provider. You may be prescribed medicines that help lower the risk of type 2 diabetes.  This information is not intended to replace advice given to you by your health care provider. Make sure you discuss any questions you have with your health care provider. Document Released: 11/07/2015 Document Revised: 12/22/2015 Document Reviewed: 09/06/2015 Elsevier Interactive Patient Education  [employer].    If you have lab work done today you will be contacted with your lab results within the next 2 weeks.  If you have not heard from [place] then please contact us. The fastest way to get your results is to register for My Chart.   IF you received an x-ray today, you will receive an invoice from [facility]iology. Please contact [facility] Radiology at [phone] with questions or concerns regarding your invoice.   IF you received labwork today, you will receive an invoice from [place]. Please contact [facility] at [phone] with questions or concerns regarding your invoice.   Our billing staff will not be able to assist you with questions regarding bills from these companies.  You will be contacted with the lab results as soon as they are available. The fastest way to get your results is to activate your My Chart account. Instructions are located on the last page of this paperwork. If you have not heard from [place] regarding the results in 2 weeks, please contact  this office.       I personally performed the services described in this documentation, which was scribed in my presence. The recorded information has been reviewed and considered for accuracy and completeness, addended by me as needed, and agree with information above.  Signed,   Merri Ray, MD [facility] at [place].  06/03/18 1:58 PM

## 2018-06-03 NOTE — Patient Instructions (Addendum)
I will recheck cholesterol and diabetes/prediabetes test today. Keep up the good work with diet and exercise.   I will check the PSA today, but follow up should be determined and discussed with your urologist.   I do recommend flu vaccine, shingles vaccine and pneumonia vaccines. Let me know if you would like those given.   I placed a referral to dermatology for the area on scalp and abdomen.  Thanks for coming in today.    Prediabetes Prediabetes is the condition of having a blood sugar (blood glucose) level that is higher than it should be, but not high enough for you to be diagnosed with type 2 diabetes. Having prediabetes puts you at risk for developing type 2 diabetes (type 2 diabetes mellitus). Prediabetes may be called impaired glucose tolerance or impaired fasting glucose. Prediabetes usually does not cause symptoms. Your health care provider can diagnose this condition with blood tests. You may be tested for prediabetes if you are overweight and if you have at least one other risk factor for prediabetes. Risk factors for prediabetes include:  Having a family member with type 2 diabetes.  Being overweight or obese.  Being older than age 79.  Being of American-Indian, African-American, Hispanic/Latino, or Asian/Pacific Islander descent.  Having an inactive (sedentary) lifestyle.  Having a history of gestational diabetes or polycystic ovarian syndrome (PCOS).  Having low levels of good cholesterol (HDL-C) or high levels of blood fats (triglycerides).  Having high blood pressure.  What is blood glucose and how is blood glucose measured?  Blood glucose refers to the amount of glucose in your bloodstream. Glucose comes from eating foods that contain sugars and starches (carbohydrates) that the body breaks down into glucose. Your blood glucose level may be measured in mg/dL (milligrams per deciliter) or mmol/L (millimoles per liter).Your blood glucose may be checked with one or  more of the following blood tests:  A fasting blood glucose (FBG) test. You will not be allowed to eat (you will fast) for at least 8 hours before a blood sample is taken. ? A normal range for FBG is 70-100 mg/dl (3.9-5.6 mmol/L).  An A1c (hemoglobin A1c) blood test. This test provides information about blood glucose control over the previous 2?68months.  An oral glucose tolerance test (OGTT). This test measures your blood glucose twice: ? After fasting. This is your baseline level. ? Two hours after you drink a beverage that contains glucose.  You may be diagnosed with prediabetes:  If your FBG is 100?125 mg/dL (5.6-6.9 mmol/L).  If your A1c level is 5.7?6.4%.  If your OGGT result is 140?199 mg/dL (7.8-11 mmol/L).  These blood tests may be repeated to confirm your diagnosis. What happens if blood glucose is too high? The pancreas produces a hormone (insulin) that helps move glucose from the bloodstream into cells. When cells in the body do not respond properly to insulin that the body makes (insulin resistance), excess glucose builds up in the blood instead of going into cells. As a result, high blood glucose (hyperglycemia) can develop, which can cause many complications. This is a symptom of prediabetes. What can happen if blood glucose stays higher than normal for a long time? Having high blood glucose for a long time is dangerous. Too much glucose in your blood can damage your nerves and blood vessels. Long-term damage can lead to complications from diabetes, which may include:  Heart disease.  Stroke.  Blindness.  Kidney disease.  Depression.  Poor circulation in the feet and legs,  which could lead to surgical removal (amputation) in severe cases.  How can prediabetes be prevented from turning into type 2 diabetes?  To help prevent type 2 diabetes, take the following actions:  Be physically active. ? Do moderate-intensity physical activity for at least 30 minutes on at  least 5 days of the week, or as much as told by your health care provider. This could be brisk walking, biking, or water aerobics. ? Ask your health care provider what activities are safe for you. A mix of physical activities may be best, such as walking, swimming, cycling, and strength training.  Lose weight as told by your health care provider. ? Losing 5-7% of your body weight can reverse insulin resistance. ? Your health care provider can determine how much weight loss is best for you and can help you lose weight safely.  Follow a healthy meal plan. This includes eating lean proteins, complex carbohydrates, fresh fruits and vegetables, low-fat dairy products, and healthy fats. ? Follow instructions from your health care provider about eating or drinking restrictions. ? Make an appointment to see a diet and nutrition specialist (registered dietitian) to help you create a healthy eating plan that is right for you.  Do not smoke or use any tobacco products, such as cigarettes, chewing tobacco, and e-cigarettes. If you need help quitting, ask your health care provider.  Take over-the-counter and prescription medicines as told by your health care provider. You may be prescribed medicines that help lower the risk of type 2 diabetes.  This information is not intended to replace advice given to you by your health care provider. Make sure you discuss any questions you have with your health care provider. Document Released: 11/07/2015 Document Revised: 12/22/2015 Document Reviewed: 09/06/2015 Elsevier Interactive Patient Education  [employer].    If you have lab work done today you will be contacted with your lab results within the next 2 weeks.  If you have not heard from [place] then please contact us. The fastest way to get your results is to register for My Chart.   IF you received an x-ray today, you will receive an invoice from [facility]. Please contact [facility] at  [phone] with questions or concerns regarding your invoice.   IF you received labwork today, you will receive an invoice from [place]. Please contact [facility] at [phone] with questions or concerns regarding your invoice.   Our billing staff will not be able to assist you with questions regarding bills from these companies.  You will be contacted with the lab results as soon as they are available. The fastest way to get your results is to activate your My Chart account. Instructions are located on the last page of this paperwork. If you have not heard from [place] regarding the results in 2 weeks, please contact this office.

## 2018-06-04 LAB — COMPREHENSIVE METABOLIC PANEL
ALT: 42 IU/L (ref 0–44)
AST: 30 IU/L (ref 0–40)
Albumin/Globulin Ratio: 2.1 (ref 1.2–2.2)
Albumin: 4.4 g/dL (ref 3.5–4.8)
Alkaline Phosphatase: 93 IU/L (ref 39–117)
BUN / CREAT RATIO: 16 (ref 10–24)
BUN: 18 mg/dL (ref 8–27)
Bilirubin Total: 0.5 mg/dL (ref 0.0–1.2)
CO2: 25 mmol/L (ref 20–29)
CREATININE: 1.11 mg/dL (ref 0.76–1.27)
Calcium: 9.5 mg/dL (ref 8.6–10.2)
Chloride: 104 mmol/L (ref 96–106)
GFR calc Af Amer: 77 mL/min/{1.73_m2} (ref >59)
GFR, EST NON AFRICAN AMERICAN: 67 mL/min/{1.73_m2} (ref >59)
GLOBULIN, TOTAL: 2.1 g/dL (ref 1.5–4.5)
GLUCOSE: 109 mg/dL — AB (ref 65–99)
Potassium: 5 mmol/L (ref 3.5–5.2)
SODIUM: 142 mmol/L (ref 134–144)
Total Protein: 6.5 g/dL (ref 6.0–8.5)

## 2018-06-04 LAB — LIPID PANEL
Chol/HDL Ratio: 4.3 ratio (ref 0.0–5.0)
Cholesterol, Total: 155 mg/dL (ref 100–199)
HDL: 36 mg/dL — ABNORMAL LOW (ref >39)
LDL CALC: 106 mg/dL — AB (ref 0–99)
Triglycerides: 66 mg/dL (ref 0–149)
VLDL CHOLESTEROL CAL: 13 mg/dL (ref 5–40)

## 2018-06-04 LAB — PSA: Prostate Specific Ag, Serum: 6 ng/mL — ABNORMAL HIGH (ref 0.0–4.0)

## 2018-06-04 LAB — HEMOGLOBIN A1C
ESTIMATED AVERAGE GLUCOSE: 120 mg/dL
Hgb A1c MFr Bld: 5.8 % — ABNORMAL HIGH (ref 4.8–5.6)

## 2018-06-17 ENCOUNTER — Encounter: Payer: Self-pay | Admitting: Family Medicine

## 2018-06-17 NOTE — Progress Notes (Signed)
Lab letter has been sent via mail to patients home address 

## 2018-07-01 ENCOUNTER — Encounter: Payer: Self-pay | Admitting: *Deleted

## 2018-08-25 DIAGNOSIS — B078 Other viral warts: Secondary | ICD-10-CM | POA: Diagnosis not present

## 2018-08-25 DIAGNOSIS — D485 Neoplasm of uncertain behavior of skin: Secondary | ICD-10-CM | POA: Diagnosis not present

## 2018-08-25 DIAGNOSIS — L821 Other seborrheic keratosis: Secondary | ICD-10-CM | POA: Diagnosis not present

## 2018-09-22 ENCOUNTER — Encounter: Payer: Self-pay | Admitting: Family Medicine

## 2019-02-08 IMAGING — DX DG FOOT COMPLETE 3+V*L*
3 series · 3 of 3 positions shown · non-contrast
Comparison: None.

CLINICAL DATA: Lateral left foot pain and swelling.

EXAM:
LEFT FOOT - COMPLETE 3+ VIEW

[foot ap]
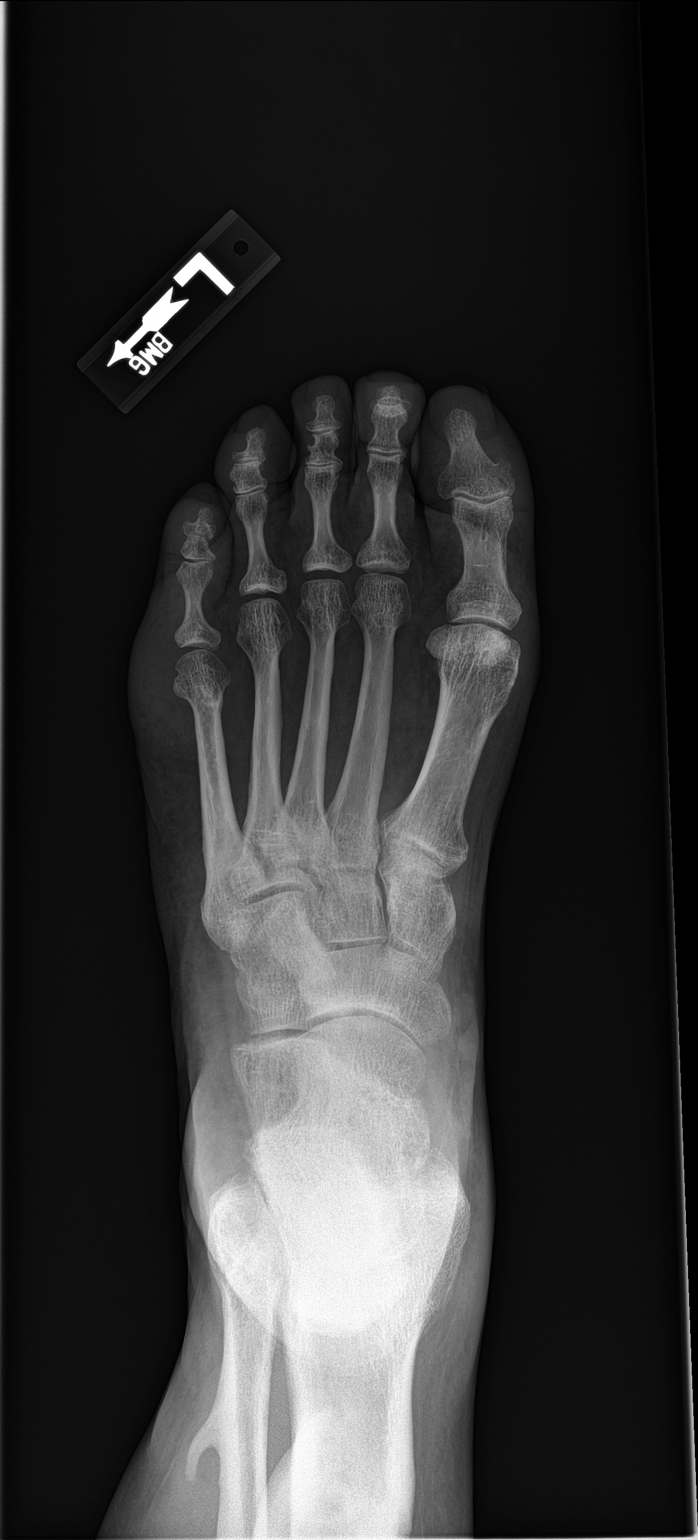

[foot obl]
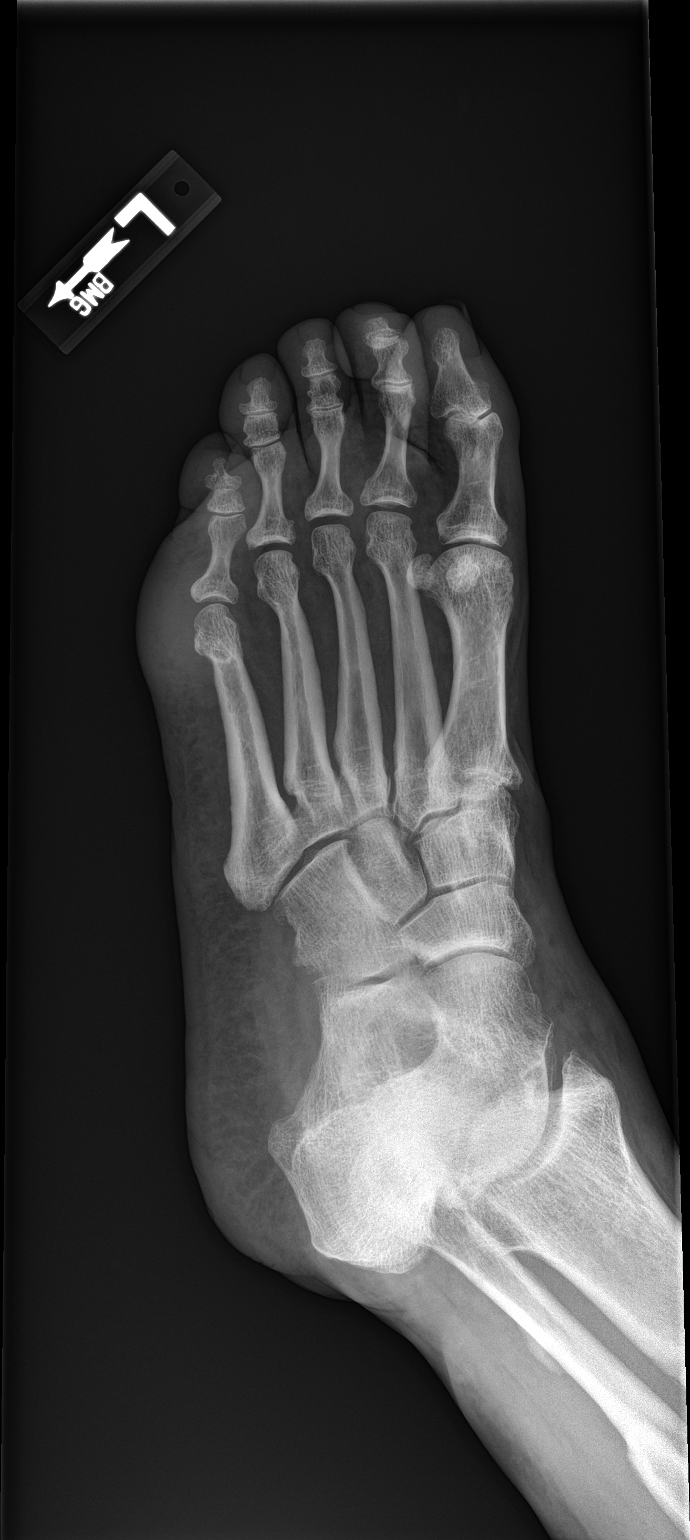

[foot lat]
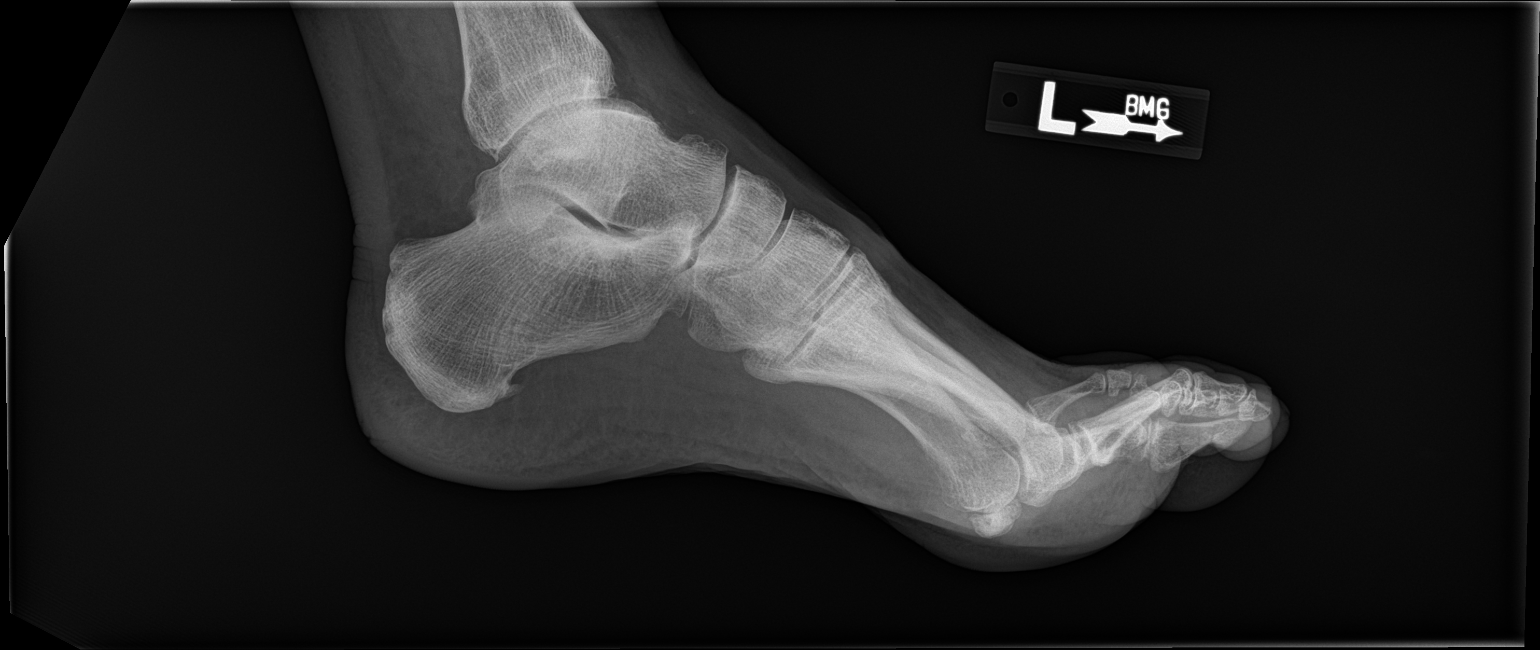

[3 of 3 positions shown; findings below may reference images not displayed]

FINDINGS: There is no evidence of fracture or dislocation. There is no
evidence of arthropathy or other focal bone abnormality. Marked
swelling centered at the fifth metatarsophalangeal joint.
IMPRESSION: Marked lateral foot swelling at the level of the fifth
metatarsophalangeal joint without evidence of osseous abnormalities.

## 2019-06-09 DIAGNOSIS — R3911 Hesitancy of micturition: Secondary | ICD-10-CM | POA: Diagnosis not present

## 2019-06-09 DIAGNOSIS — Z125 Encounter for screening for malignant neoplasm of prostate: Secondary | ICD-10-CM | POA: Diagnosis not present

## 2019-06-09 DIAGNOSIS — N401 Enlarged prostate with lower urinary tract symptoms: Secondary | ICD-10-CM | POA: Diagnosis not present

## 2019-06-09 DIAGNOSIS — R739 Hyperglycemia, unspecified: Secondary | ICD-10-CM | POA: Diagnosis not present

## 2019-06-09 DIAGNOSIS — E538 Deficiency of other specified B group vitamins: Secondary | ICD-10-CM | POA: Diagnosis not present

## 2019-06-09 DIAGNOSIS — Z0000 Encounter for general adult medical examination without abnormal findings: Secondary | ICD-10-CM | POA: Diagnosis not present

## 2019-06-09 DIAGNOSIS — E782 Mixed hyperlipidemia: Secondary | ICD-10-CM | POA: Diagnosis not present

## 2019-07-13 ENCOUNTER — Other Ambulatory Visit: Payer: Self-pay

## 2019-07-13 DIAGNOSIS — Z20822 Contact with and (suspected) exposure to covid-19: Secondary | ICD-10-CM

## 2019-07-13 DIAGNOSIS — Z20828 Contact with and (suspected) exposure to other viral communicable diseases: Secondary | ICD-10-CM | POA: Diagnosis not present

## 2019-07-14 LAB — NOVEL CORONAVIRUS, NAA: SARS-CoV-2, NAA: DETECTED — AB

## 2019-07-15 ENCOUNTER — Telehealth: Payer: Self-pay | Admitting: Unknown Physician Specialty

## 2019-07-15 NOTE — Telephone Encounter (Signed)
Called to discuss with patient about Covid symptoms and the use of bamlanivimab, a monoclonal antibody infusion for those with mild to moderate Covid symptoms and at a high risk of hospitalization.  Pt is qualified for this infusion at the [facility] due to Age > 65   Message left to call back  

## 2019-11-19 DIAGNOSIS — C61 Malignant neoplasm of prostate: Secondary | ICD-10-CM | POA: Diagnosis not present

## 2020-06-13 DIAGNOSIS — E782 Mixed hyperlipidemia: Secondary | ICD-10-CM | POA: Diagnosis not present

## 2020-06-13 DIAGNOSIS — Z833 Family history of diabetes mellitus: Secondary | ICD-10-CM | POA: Diagnosis not present

## 2020-06-13 DIAGNOSIS — N401 Enlarged prostate with lower urinary tract symptoms: Secondary | ICD-10-CM | POA: Diagnosis not present

## 2020-06-13 DIAGNOSIS — R972 Elevated prostate specific antigen [PSA]: Secondary | ICD-10-CM | POA: Diagnosis not present

## 2020-06-13 DIAGNOSIS — Z8249 Family history of ischemic heart disease and other diseases of the circulatory system: Secondary | ICD-10-CM | POA: Diagnosis not present

## 2020-06-13 DIAGNOSIS — Z125 Encounter for screening for malignant neoplasm of prostate: Secondary | ICD-10-CM | POA: Diagnosis not present

## 2020-06-13 DIAGNOSIS — R3911 Hesitancy of micturition: Secondary | ICD-10-CM | POA: Diagnosis not present

## 2020-06-13 DIAGNOSIS — R739 Hyperglycemia, unspecified: Secondary | ICD-10-CM | POA: Diagnosis not present

## 2020-06-13 DIAGNOSIS — Z0000 Encounter for general adult medical examination without abnormal findings: Secondary | ICD-10-CM | POA: Diagnosis not present

## 2022-07-24 ENCOUNTER — Ambulatory Visit (HOSPITAL_COMMUNITY)
Admission: EM | Admit: 2022-07-24 | Discharge: 2022-07-24 | Disposition: A | Discharge status: Home / Self Care | Payer: Medicare Other

## 2022-09-29 ENCOUNTER — Encounter (HOSPITAL_COMMUNITY): Payer: Self-pay | Admitting: Emergency Medicine

## 2022-09-29 ENCOUNTER — Emergency Department (HOSPITAL_COMMUNITY)
Admission: EM | Admit: 2022-09-29 | Discharge: 2022-09-29 | Disposition: A | Discharge status: Home / Self Care | Payer: Medicare Other | Attending: Emergency Medicine | Admitting: Emergency Medicine

## 2022-09-29 ENCOUNTER — Other Ambulatory Visit: Payer: Self-pay

## 2022-09-29 DIAGNOSIS — R1030 Lower abdominal pain, unspecified: Secondary | ICD-10-CM | POA: Insufficient documentation

## 2022-09-29 DIAGNOSIS — R339 Retention of urine, unspecified: Secondary | ICD-10-CM | POA: Diagnosis present

## 2022-09-29 DIAGNOSIS — Z7982 Long term (current) use of aspirin: Secondary | ICD-10-CM | POA: Insufficient documentation

## 2022-09-29 LAB — URINALYSIS, ROUTINE W REFLEX MICROSCOPIC
Bilirubin Urine: NEGATIVE
Glucose, UA: NEGATIVE mg/dL
Hgb urine dipstick: NEGATIVE
Ketones, ur: NEGATIVE mg/dL
Leukocytes,Ua: NEGATIVE
Nitrite: NEGATIVE
Protein, ur: NEGATIVE mg/dL
Specific Gravity, Urine: 1.004 — ABNORMAL LOW (ref 1.005–1.030)
pH: 7 (ref 5.0–8.0)

## 2022-09-29 LAB — I-STAT CHEM 8, ED
BUN: 17 mg/dL (ref 8–23)
Calcium, Ion: 1.15 mmol/L (ref 1.15–1.40)
Chloride: 105 mmol/L (ref 98–111)
Creatinine, Ser: 1.1 mg/dL (ref 0.61–1.24)
Glucose, Bld: 121 mg/dL — ABNORMAL HIGH (ref 70–99)
HCT: 44 % (ref 39.0–52.0)
Hemoglobin: 15 g/dL (ref 13.0–17.0)
Potassium: 3.9 mmol/L (ref 3.5–5.1)
Sodium: 141 mmol/L (ref 135–145)
TCO2: 21 mmol/L — ABNORMAL LOW (ref 22–32)

## 2022-09-29 MED ORDER — LIDOCAINE HCL URETHRAL/MUCOSAL 2 % EX GEL
1.0000 | Freq: Once | CUTANEOUS | Status: AC
  Administered 2022-09-29: 1 via TOPICAL
  Filled 2022-09-29: qty 11

## 2022-09-29 NOTE — ED Notes (Signed)
Educated patient on self care for foley, provided patient with leg bag and standard foley bag. Patient and significant other verbalized understanding of care.

## 2022-09-29 NOTE — ED Provider Notes (Signed)
[facility] Provider Note   CSN: [id number] Arrival date & time: 09/29/22  0343     History  Chief Complaint  Patient presents with   Urinary Retention    Gary Roberts. is a 77 y.o. male.  HPI   Medical history including BPH, kidney stones, presents with complaints of unable to urinate.  Patient states that he has been unable to urinate since 11 PM yesterday, states that he has symptomatic distention in his abdomen, states that he has had this happen in the past and he had a drain 1100 cc of urine.  He states that this is likely from his enlarged prostate.  HPI is limited as patient was in distress due to his difficulty with urination.  Reviewed patient's chart has a history of kidney stones requiring stents, after which he developed urinary retention, Foley catheter was placed and he was started on antibiotics.  Patient has been seen most recently about 6 months ago for difficulty urination catheter was placed and patient was discharged home.  Home Medications Prior to Admission medications   Medication Sig Start Date End Date Taking? Authorizing Provider  aspirin 81 MG tablet Take 81 mg by mouth daily.    [provider]  vitamin B-12 (CYANOCOBALAMIN) 1000 MCG tablet Take 1,000 mcg by mouth daily.    [provider]      Allergies    Patient has no known allergies.    Review of Systems   Review of Systems  Constitutional:  Negative for chills and fever.  Respiratory:  Negative for shortness of breath.   Cardiovascular:  Negative for chest pain.  Gastrointestinal:  Negative for abdominal pain.  Genitourinary:  Positive for difficulty urinating.  Neurological:  Negative for headaches.    Physical Exam Updated Vital Signs BP (!) 176/97   Pulse 78   Temp (!) 97.5 F (36.4 C) (Oral)   Resp 18   Ht '5\' 10"'$  (1.778 m)   Wt 85 kg   SpO2 99%   BMI 26.89 kg/m  Physical Exam Vitals and nursing note  reviewed.  Constitutional:      General: He is in acute distress.     Appearance: He is not ill-appearing.  HENT:     Head: Normocephalic and atraumatic.     Nose: No congestion.  Eyes:     Conjunctiva/sclera: Conjunctivae normal.  Cardiovascular:     Rate and Rhythm: Normal rate and regular rhythm.     Pulses: Normal pulses.     Heart sounds: No murmur heard.    No friction rub. No gallop.  Pulmonary:     Effort: No respiratory distress.     Breath sounds: No wheezing, rhonchi or rales.  Abdominal:     Palpations: Abdomen is soft.     Tenderness: There is abdominal tenderness. There is no right CVA tenderness or left CVA tenderness.     Comments: Suprapubic distention present, tender around the suprapubic region, guarding noted, without rebound tenderness or peritoneal sign.  Skin:    General: Skin is warm and dry.  Neurological:     Mental Status: He is alert.  Psychiatric:        Mood and Affect: Mood normal.     ED Results / Procedures / Treatments   Labs (all labs ordered are listed, but only abnormal results are displayed) Labs Reviewed  URINALYSIS, ROUTINE W REFLEX MICROSCOPIC - Abnormal; Notable for the following components:  Result Value   Color, Urine STRAW (*)    Specific Gravity, Urine 1.004 (*)    All other components within normal limits  I-STAT CHEM 8, ED - Abnormal; Notable for the following components:   Glucose, Bld 121 (*)    TCO2 21 (*)    All other components within normal limits    EKG None  Radiology No results found.  Procedures Procedures    Medications Ordered in ED Medications  lidocaine (XYLOCAINE) 2 % jelly 1 Application (1 Application Topical Given 09/29/22 0424)    ED Course/ Medical Decision Making/ A&P                             Medical Decision Making Amount and/or Complexity of Data Reviewed Labs: ordered.   This patient presents to the ED for concern of urinary retention, this involves an extensive number of  treatment options, and is a complaint that carries with it a high risk of complications and morbidity.  The differential diagnosis includes UTI, pyelo-, kidney stone, AKI    Additional history obtained:  Additional history obtained from wife at bedside External records from outside source obtained and reviewed including neurology notes   Co morbidities that complicate the patient evaluation  BPH  Social Determinants of Health:  N/A    Lab Tests:  I Ordered, and personally interpreted labs.  The pertinent results include: I-STAT Chem-8 unremarkable   Imaging Studies ordered:  I ordered imaging studies including N/A I independently visualized and interpreted imaging which showed N/A I agree with the radiologist interpretation   Cardiac Monitoring:  The patient was maintained on a cardiac monitor.  I personally viewed and interpreted the cardiac monitored which showed an underlying rhythm of: N/A   Medicines ordered and prescription drug management:  I ordered medication including N/A I have reviewed the patients home medicines and have made adjustments as needed  Critical Interventions:  N/A   Reevaluation:  Presents with concerns of difficulty urination, patient noted suprapubic distention, he is tender to palpation, patient appears very uncomfortable, due to his BPH and urinary retention will place Foley cath.  Reassessed the patient, after Foley catheter was in place, states he is feeling much better, he is put out about 700 cc of urine, abdomen is soft nontender, will await UA.  Patient agreement discharge at this time.    Consultations Obtained:  N/A    Test Considered:  CT renal-deferred my suspicion for kidney stone is low at this time, no endorsing any flank pain dysuria hematuria.  Patient history of BPH causing urinary retention presentation seems most consistent with this.    Rule out Suspicion for sepsis is low at this time  nontoxic-appearing, vital signs reassuring.  I doubt UTI no endorsing dysuria hematuria, UA is negative for signs infection or hematuria.  I doubt kidney stone understanding flank pain, no suprapubic pain, UA is negative for hematuria.    Dispostion and problem list  After consideration of the diagnostic results and the patients response to treatment, I feel that the patent would benefit from discharge.   Retention-suspect secondary due to BPH, will discharge home with Foley catheter, follow-up with urologist for further evaluation and strict return precautions.            Final Clinical Impression(s) / ED Diagnoses Final diagnoses:  Urinary retention    Rx / DC Orders ED Discharge Orders     None  Marcello Fennel, PA-C 09/29/22 0528    Fatima Blank, MD 09/29/22 [phone]

## 2022-09-29 NOTE — Discharge Instructions (Addendum)
You had urinary obstruction suspect is from your prostate, please maintain the Foley catheter as shown today.  Please follow-up with urology for further assessment  Come back to the emergency department if you develop chest pain, shortness of breath, severe abdominal pain, uncontrolled nausea, vomiting, diarrhea.

## 2022-09-29 NOTE — ED Triage Notes (Signed)
Patient unable to urinate since 11 pm last night, experiencing abdominal pain from the retention. Patient had a foley catheter in June but since removed d/t prostate issues.

## 2022-10-11 ENCOUNTER — Emergency Department (HOSPITAL_COMMUNITY)
Admission: EM | Admit: 2022-10-11 | Discharge: 2022-10-11 | Disposition: A | Discharge status: Home / Self Care | Payer: Medicare Other | Attending: Emergency Medicine | Admitting: Emergency Medicine

## 2022-10-11 ENCOUNTER — Other Ambulatory Visit: Payer: Self-pay

## 2022-10-11 ENCOUNTER — Encounter (HOSPITAL_COMMUNITY): Payer: Self-pay

## 2022-10-11 DIAGNOSIS — Z7982 Long term (current) use of aspirin: Secondary | ICD-10-CM | POA: Diagnosis not present

## 2022-10-11 DIAGNOSIS — R339 Retention of urine, unspecified: Secondary | ICD-10-CM | POA: Diagnosis present

## 2022-10-11 LAB — URINALYSIS, ROUTINE W REFLEX MICROSCOPIC

## 2022-10-11 LAB — URINALYSIS, MICROSCOPIC (REFLEX): RBC / HPF: >50 RBC/hpf (ref 0–5)

## 2022-10-11 NOTE — ED Provider Triage Note (Addendum)
Emergency Medicine Provider Triage Evaluation Note  Gary Roberts. , a 77 y.o. male  was evaluated in triage.  Patient presenting with the concern for urinary retention.  Reports he had a similar visit around 2 weeks ago.  A week ago he had a TURP procedure.  Earlier today he started to pass blood clots and then around 5 PM stopped urinating altogether.  Reports he has been staying very hydrated and can feel the "urine backing up in his bladder."  Patient is very uncomfortable, unable to sit still.  Offered pain medication however he declined this at this time.  Will bladder scan and insert Foley catheter  Review of Systems  Positive:  Negative:   Physical Exam  BP 128/71 (BP Location: Right Arm)   Pulse (!) 105   Temp 97.7 F (36.5 C)   Resp 17   SpO2 100%  Gen:   Awake, no distress   Resp:  Normal effort  MSK:   Moves extremities without difficulty  Other:    Medical Decision Making  Medically screening exam initiated at 7:40 PM.  Appropriate orders placed.  Gary Roberts. was informed that the remainder of the evaluation will be completed by another provider, this initial triage assessment does not replace that evaluation, and the importance of remaining in the ED until their evaluation is complete.     Rhae Hammock, [place] 10/11/22 1942   448 bladder scan   Rhae Hammock, PA-C 10/11/22 1952

## 2022-10-11 NOTE — ED Triage Notes (Signed)
TURP 7 days ago.   Tonight around 5PM was unable to urinate. Admits to passing large amount of blood and clots. Suprapubic pain rated 10/10.

## 2022-10-11 NOTE — ED Notes (Signed)
Foley leaking bloody output. Left prior to flushing foley. After foley placement ~500cc urine returned. Pain decreased to 0/10. Standard drainage bag remains intact and in place. Ambulatory oi departure. Unable to obtain new vitals as eloped from triage room.

## 2022-10-11 NOTE — Discharge Instructions (Addendum)
Please call your urologist tomorrow to see when they would like to see you.  Do not hesitate to return with any recurring symptoms.

## 2022-10-11 NOTE — ED Provider Notes (Signed)
[facility] Provider Note   CSN: [id number] Arrival date & time: 10/11/22  1912     History  Chief Complaint  Patient presents with   Urinary Retention    Gary Roberts. is a 77 y.o. male with a past medical history of BPH and nephrolithiasis presenting today due to urinary retention.  On March 2 patient was seen and had the same thing happened.  A Foley catheter was placed and he subsequently followed up with urology with [facility].  He had a TURP procedure on 3/6 and was discharged with a Foley catheter.  Catheter was subsequently removed 2 days ago by urology.  He was urinating normally until about 5 PM today.  He said that just prior to 5 he was passing multiple blood clots and he has been experiencing urinary retention over the past 3 hours.  Endorsing excruciating pain.  HPI     Home Medications Prior to Admission medications   Medication Sig Start Date End Date Taking? Authorizing Provider  aspirin 81 MG tablet Take 81 mg by mouth daily.    [provider]  vitamin B-12 (CYANOCOBALAMIN) 1000 MCG tablet Take 1,000 mcg by mouth daily.    [provider]      Allergies    Patient has no known allergies.    Review of Systems   Review of Systems  Physical Exam Updated Vital Signs BP 128/71 (BP Location: Right Arm)   Pulse (!) 105   Temp 97.7 F (36.5 C)   Resp 17   SpO2 100%  Physical Exam Vitals and nursing note reviewed.  Constitutional:      Appearance: Normal appearance.  HENT:     Head: Normocephalic and atraumatic.  Eyes:     General: No scleral icterus.    Conjunctiva/sclera: Conjunctivae normal.  Pulmonary:     Effort: Pulmonary effort is normal. No respiratory distress.  Abdominal:     General: Abdomen is flat.     Palpations: Abdomen is soft.     Tenderness: There is abdominal tenderness (suprapubic).  Skin:    General: Skin is warm and dry.     Findings: No rash.   Neurological:     Mental Status: He is alert.  Psychiatric:        Mood and Affect: Mood normal.     ED Results / Procedures / Treatments   Labs (all labs ordered are listed, but only abnormal results are displayed) Labs Reviewed  URINALYSIS, ROUTINE W REFLEX MICROSCOPIC - Abnormal; Notable for the following components:      Result Value   Color, Urine RED (*)    APPearance TURBID (*)    Glucose, UA   (*)    Value: TEST NOT REPORTED DUE TO COLOR INTERFERENCE OF URINE PIGMENT   Hgb urine dipstick   (*)    Value: TEST NOT REPORTED DUE TO COLOR INTERFERENCE OF URINE PIGMENT   Bilirubin Urine   (*)    Value: TEST NOT REPORTED DUE TO COLOR INTERFERENCE OF URINE PIGMENT   Ketones, ur   (*)    Value: TEST NOT REPORTED DUE TO COLOR INTERFERENCE OF URINE PIGMENT   Protein, ur   (*)    Value: TEST NOT REPORTED DUE TO COLOR INTERFERENCE OF URINE PIGMENT   Nitrite   (*)    Value: TEST NOT REPORTED DUE TO COLOR INTERFERENCE OF URINE PIGMENT   Leukocytes,Ua   (*)    Value: TEST NOT  REPORTED DUE TO COLOR INTERFERENCE OF URINE PIGMENT   All other components within normal limits  URINALYSIS, MICROSCOPIC (REFLEX) - Abnormal; Notable for the following components:   Bacteria, UA RARE (*)    All other components within normal limits    EKG None  Radiology No results found.  Procedures Procedures   Medications Ordered in ED Medications - No data to display  ED Course/ Medical Decision Making/ A&P                             Medical Decision Making Amount and/or Complexity of Data Reviewed Labs: ordered.   77 year old male presenting today with urinary retention.  Differential includes but is not limited to obstructive nephrolithiasis, BPH, malignancy.  This is not an exhaustive differential.    Past Medical History / Co-morbidities / Social History: BPH and recurrent urinary retention   Additional history: Per external chart review patient follows with urology with [facility].  Dr. Harlow Asa follows him for his BPH and did his chart.  There were no complications per the operation note   Physical Exam: Pertinent physical exam findings include Suprapubic tenderness, no CVA tenderness Gross hematuria and urinalysis and output  Lab Tests: I ordered, and personally interpreted labs.  The pertinent results include: Urinalysis with gross hematuria.  Unable to assess for nitrites and leukocytes.  Only 6-10 WBCs, will not treat for infection at this time   Imaging Studies: Bladder scan 448    Medications: Declined   MDM/Disposition: This is a 77 year old male presenting today with urinary retention.  He had the same problem happened 2 weeks ago.  Had a TURP a week ago.  448 cc in the patient's bladder.  He declined any oral pain medication.  Foley catheter was placed successfully by nursing.  There were 430 cc relief from the bladder, consistent with patient's original scan.  Will leave in Foley catheter and encourage follow-up with urology.  He and his wife are agreeable.     I discussed this case with my attending physician Dr. Langston Masker who cosigned this note including patient's presenting symptoms, physical exam, and planned diagnostics and interventions. Attending physician stated agreement with plan or made changes to plan which were implemented.      Final Clinical Impression(s) / ED Diagnoses Final diagnoses:  Urinary retention    Rx / DC Orders ED Discharge Orders     None      Results and diagnoses were explained to the patient. Return precautions discussed in full. Patient had no additional questions and expressed complete understanding.   This chart was dictated using voice recognition software.  Despite best efforts to proofread,  errors can occur which can change the documentation meaning.    Darliss Ridgel 10/11/22 2136    Wyvonnia Dusky, MD 10/11/22 2137
